# Patient Record
Sex: Female | Born: 1960 | Race: White | Hispanic: No | Marital: Married | Smoking: Former smoker
Health system: Southern US, Community
[De-identification: ages and names within clinical notes are randomized; demographics above are authoritative.]

## PROBLEM LIST (undated history)

## (undated) DIAGNOSIS — I10 Essential (primary) hypertension: Secondary | ICD-10-CM

## (undated) DIAGNOSIS — R112 Nausea with vomiting, unspecified: Secondary | ICD-10-CM

## (undated) DIAGNOSIS — M1990 Unspecified osteoarthritis, unspecified site: Secondary | ICD-10-CM

## (undated) DIAGNOSIS — M792 Neuralgia and neuritis, unspecified: Secondary | ICD-10-CM

## (undated) DIAGNOSIS — Z8619 Personal history of other infectious and parasitic diseases: Secondary | ICD-10-CM

## (undated) DIAGNOSIS — M62838 Other muscle spasm: Secondary | ICD-10-CM

## (undated) DIAGNOSIS — G8929 Other chronic pain: Secondary | ICD-10-CM

## (undated) DIAGNOSIS — K5120 Ulcerative (chronic) proctitis without complications: Secondary | ICD-10-CM

## (undated) DIAGNOSIS — K644 Residual hemorrhoidal skin tags: Secondary | ICD-10-CM

## (undated) DIAGNOSIS — K648 Other hemorrhoids: Secondary | ICD-10-CM

## (undated) DIAGNOSIS — M549 Dorsalgia, unspecified: Secondary | ICD-10-CM

## (undated) DIAGNOSIS — Z8709 Personal history of other diseases of the respiratory system: Secondary | ICD-10-CM

## (undated) DIAGNOSIS — E785 Hyperlipidemia, unspecified: Secondary | ICD-10-CM

## (undated) DIAGNOSIS — G43909 Migraine, unspecified, not intractable, without status migrainosus: Secondary | ICD-10-CM

## (undated) DIAGNOSIS — Z9889 Other specified postprocedural states: Secondary | ICD-10-CM

## (undated) HISTORY — DX: Other specified postprocedural states: R11.2

## (undated) HISTORY — DX: Residual hemorrhoidal skin tags: K64.4

## (undated) HISTORY — PX: COLONOSCOPY: SHX174

## (undated) HISTORY — DX: Ulcerative (chronic) proctitis without complications: K51.20

## (undated) HISTORY — DX: Hyperlipidemia, unspecified: E78.5

## (undated) HISTORY — PX: OTHER SURGICAL HISTORY: SHX169

## (undated) HISTORY — DX: Neuralgia and neuritis, unspecified: M79.2

## (undated) HISTORY — PX: BREAST ENHANCEMENT SURGERY: SHX7

## (undated) HISTORY — PX: BUNIONECTOMY: SHX129

## (undated) HISTORY — DX: Other specified postprocedural states: Z98.890

## (undated) HISTORY — DX: Essential (primary) hypertension: I10

## (undated) HISTORY — DX: Unspecified osteoarthritis, unspecified site: M19.90

## (undated) HISTORY — DX: Other hemorrhoids: K64.8

## (undated) HISTORY — DX: Migraine, unspecified, not intractable, without status migrainosus: G43.909

## (undated) HISTORY — PX: ANTERIOR CRUCIATE LIGAMENT REPAIR: SHX115

## (undated) HISTORY — PX: POLYPECTOMY: SHX149

---

## 1999-12-05 ENCOUNTER — Other Ambulatory Visit: Admission: RE | Admit: 1999-12-05 | Discharge: 1999-12-05 | Payer: Self-pay | Admitting: Family Medicine

## 2001-05-28 ENCOUNTER — Ambulatory Visit (HOSPITAL_BASED_OUTPATIENT_CLINIC_OR_DEPARTMENT_OTHER): Admission: RE | Admit: 2001-05-28 | Discharge: 2001-05-29 | Payer: Self-pay | Admitting: Otolaryngology

## 2001-05-28 ENCOUNTER — Encounter (INDEPENDENT_AMBULATORY_CARE_PROVIDER_SITE_OTHER): Payer: Self-pay | Admitting: *Deleted

## 2001-10-29 ENCOUNTER — Other Ambulatory Visit: Admission: RE | Admit: 2001-10-29 | Discharge: 2001-10-29 | Payer: Self-pay | Admitting: Family Medicine

## 2002-11-25 ENCOUNTER — Other Ambulatory Visit: Admission: RE | Admit: 2002-11-25 | Discharge: 2002-11-25 | Payer: Self-pay | Admitting: Family Medicine

## 2003-12-28 ENCOUNTER — Other Ambulatory Visit: Admission: RE | Admit: 2003-12-28 | Discharge: 2003-12-28 | Payer: Self-pay | Admitting: Family Medicine

## 2005-03-18 ENCOUNTER — Other Ambulatory Visit: Admission: RE | Admit: 2005-03-18 | Discharge: 2005-03-18 | Payer: Self-pay | Admitting: Family Medicine

## 2006-10-15 ENCOUNTER — Encounter: Admission: RE | Admit: 2006-10-15 | Discharge: 2006-10-15 | Payer: Self-pay | Admitting: Family Medicine

## 2006-10-21 ENCOUNTER — Encounter: Admission: RE | Admit: 2006-10-21 | Discharge: 2006-10-21 | Payer: Self-pay | Admitting: Specialist

## 2006-11-18 ENCOUNTER — Encounter: Admission: RE | Admit: 2006-11-18 | Discharge: 2006-11-18 | Payer: Self-pay | Admitting: Specialist

## 2007-09-08 ENCOUNTER — Encounter: Admission: RE | Admit: 2007-09-08 | Discharge: 2007-09-08 | Payer: Self-pay | Admitting: Family Medicine

## 2008-02-25 ENCOUNTER — Encounter: Admission: RE | Admit: 2008-02-25 | Discharge: 2008-02-25 | Payer: Self-pay | Admitting: Family Medicine

## 2008-03-13 ENCOUNTER — Encounter: Admission: RE | Admit: 2008-03-13 | Discharge: 2008-03-13 | Payer: Self-pay | Admitting: Family Medicine

## 2008-08-08 ENCOUNTER — Ambulatory Visit (HOSPITAL_COMMUNITY): Admission: RE | Admit: 2008-08-08 | Discharge: 2008-08-09 | Payer: Self-pay | Admitting: Neurological Surgery

## 2008-11-09 ENCOUNTER — Encounter: Admission: RE | Admit: 2008-11-09 | Discharge: 2008-11-09 | Payer: Self-pay | Admitting: Neurological Surgery

## 2009-08-17 HISTORY — PX: COLONOSCOPY W/ BIOPSIES: SHX1374

## 2010-02-18 ENCOUNTER — Emergency Department (HOSPITAL_COMMUNITY): Admission: EM | Admit: 2010-02-18 | Discharge: 2010-02-18 | Payer: Self-pay | Admitting: Emergency Medicine

## 2010-02-22 ENCOUNTER — Emergency Department (HOSPITAL_COMMUNITY): Admission: EM | Admit: 2010-02-22 | Discharge: 2010-02-22 | Payer: Self-pay | Admitting: Emergency Medicine

## 2010-06-06 ENCOUNTER — Encounter: Admission: RE | Admit: 2010-06-06 | Discharge: 2010-06-06 | Payer: Self-pay | Admitting: Neurological Surgery

## 2010-07-18 ENCOUNTER — Ambulatory Visit: Payer: Self-pay | Admitting: Pulmonary Disease

## 2010-07-18 DIAGNOSIS — I10 Essential (primary) hypertension: Secondary | ICD-10-CM | POA: Insufficient documentation

## 2010-07-18 DIAGNOSIS — G4700 Insomnia, unspecified: Secondary | ICD-10-CM | POA: Insufficient documentation

## 2010-07-30 DIAGNOSIS — G4700 Insomnia, unspecified: Secondary | ICD-10-CM | POA: Insufficient documentation

## 2010-10-01 NOTE — Assessment & Plan Note (Signed)
Summary: consult for insomnia, poor sleep hygiene.   Visit Type:  Initial Consult Copy to:  Edwinna Areola  Primary Mauricia Mertens/Referring Keisha Amer:  Gaynelle Arabian MD  CC:  Sleep consult. wake up multiples times at night and very tired in the day. Marland Kitchen  History of Present Illness: The pt is a 50y/o female who I have been asked to see for insomnia.  The pt has been having sleeping issues for the last 6-7 yrs, with both sleep onset and maintenance components.  She typically goes to bed btw 930 and 1030, and it will take her 2 hours to fall asleep if she does not take a sleeping pill.  With her Lorrin Mais, it will take about 42mn.  When she goes to bed at her usual time she is never sleepy, but if she waits until 12-1am, she will get to sleep quickly even without a medication.  She typically will awaken 2-3 times a night, and may take her 10-693m to fall back asleep.  She awakens at 7-8 am to start her day, and never feels rested.  She denies any hx c/w RLS, and does not think she snores.  She does have chronic back issues, but does not feel it disturbs her sleep.  She does have 3 dogs that sleep with her and her husband in the bed everynight.  The pt feels tired "all the time" during the day, and definitely naps in the afternoons.  Regarding her sleep hygiene, she does read in bed, and has a clock that projects the time up to the ceiling.  She also drinks 2 cups of coffee in the am, 1-2 caff. sodas in the afternoon, and used to drink "energy drinks" which she has now cut out.  She also notes that she can sometime have sleepiness while driving during the day.    Preventive Screening-Counseling & Management  Alcohol-Tobacco     Smoking Status: quit  Current Medications (verified): 1)  Amlodipine Besy-Benazepril Hcl 2.5-10 Mg Caps (Amlodipine Besy-Benazepril Hcl) .... Once Daily 2)  Cymbalta 60 Mg Cpep (Duloxetine Hcl) .... Once Daily 3)  Zolpidem Tartrate 10 Mg Tabs (Zolpidem Tartrate) .... 1/2 Once  Daily  Allergies (verified): 1)  ! AsDiona FantiPast History:  Past Medical History: colitis Hypertension  Past Surgical History: cyst removed off back breast implants and replaced  Family History: Reviewed history and no changes required. lymphoma--mother  Social History: Reviewed history and no changes required. Patient states former smoker. quit in 1970's. <1ppd. started 1960's. occupation-- garden maintance, pt therapt teacher marriedSmoking Status:  quit  Review of Systems  The patient denies shortness of breath with activity, shortness of breath at rest, productive cough, non-productive cough, coughing up blood, chest pain, irregular heartbeats, acid heartburn, indigestion, loss of appetite, weight change, abdominal pain, difficulty swallowing, sore throat, tooth/dental problems, headaches, nasal congestion/difficulty breathing through nose, sneezing, itching, ear ache, anxiety, depression, hand/feet swelling, joint stiffness or pain, rash, change in color of mucus, and fever.    Vital Signs:  Patient profile:   [age]ear old female Height:      62 inches Weight:      130 pounds BMI:     23.86 O2 Sat:      99 % on Room air Temp:     98.4 degrees F oral Pulse rate:   70 / minute BP sitting:   122 / 70  (left arm) Cuff size:   regular  Vitals Entered By: MiCharma IgoNovember 17, 2011 3:01 PM)  O2 Flow:  Room air  Physical Exam  General:  wd female in nad Eyes:  PERRLA and EOMI.   Nose:  patent without discharge Mouth:  normal anatomy, no exudates. Neck:  no jvd, tmg, LN Lungs:  totally clear with no wheezing or rhonchi Heart:  rrr, no mrg Abdomen:  soft and nontender, bs+ Extremities:  no edema noted, pulses intact distally no cyanosis  Neurologic:  alert and oriented ,moves all 4.   Impression & Recommendations:  Problem # 1:  INSOMNIA (ICD-780.52) the pt has longstanding insomnia that is related to multiple factors.  She has sleep hygiene issues with going  to bed before she is sleepy, reading in bed with insomnia issues, having 3 dogs sleep in the same bed, and napping during the day.  I have discussed these with her, and she will try to make corrections.  She also has behavioral issues with the inability to "turn off brain".  I have discussed with her the techniques of ritualistic behaviors and stimulus control therapy.  I have outlined what she needs to do, and is willing to give this a try.  I have reminded her that medications NEVER solve longstanding insomnia issues.  Medications Added to Medication List This Visit: 1)  Amlodipine Besy-benazepril Hcl 2.5-10 Mg Caps (Amlodipine besy-benazepril hcl) .... Once daily 2)  Cymbalta 60 Mg Cpep (Duloxetine hcl) .... Once daily 3)  Zolpidem Tartrate 10 Mg Tabs (Zolpidem tartrate) .... 1/2 once daily  Other Orders: Consultation Level V [phone])  Patient Instructions: 1)  stop sleeping pill 2)  do not go to bed before MN, and do not stay in bed more than 35mn if you cannot initiate sleep.   3)  turn off light on alarm clock, no reading or watching tv in bed. 4)  no napping during day, no caffeine after 10am. 5)  If you awaken during the night and cannot get back to sleep within 353m, leave bedroom. 6)  please call me in 2 weeks with your response.

## 2011-01-14 NOTE — Op Note (Signed)
NAME:  Shawna, Dean            ACCOUNT NO.:  [id number]   MEDICAL RECORD NO.:  [id number]          PATIENT TYPE:  OIB   LOCATION:  0762                         FACILITY:  [facility]   PHYSICIAN:  Earleen Newport, M.D.  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   OPERATION:  Laminotomy and foraminotomy with operating microscope,  resection of synovial cyst, L4-L5 right.   SURGEON:  Earleen Newport, MD   FIRST ASSISTANT:  Leeroy Cha, MD   ANESTHESIA:  General endotracheal.   INDICATIONS:  Shawna Dean is a 50 year old individual who has had  significant right lumbar radiculopathy for a number of months nearing a  year's time she has also had some episodic back pain associated with  this with significant muscle spasm.  She has evidence of a synovial cyst  on the right side at the L4-L5 level and has been advised regarding  surgical resection with a laminotomy and foraminotomy.   PROCEDURE:  The patient was brought to the operating room supine on the  stretcher after smooth induction of general endotracheal anesthesia, she  was turned prone.  The back was prepped with alcohol and DuraPrep,  draped in sterile fashion.  Localizing radiograph was identified to the  area of L4-L5 and an incision was made on the surface of the skin and  carried down to the lumbodorsal fascia.  The fascia was opened on the  right side of the midline and the interlaminar space at L4-L5 was  identified by a second radiograph.  With a self-retaining retractor in  place and the soft tissues overlying the facet joint at L4-L5 were  cleared.  There is noted to be significant extrinsic growth of synovium  around the  posterior and inferior aspect of the facet joint itself.  Once this was resected, the laminotomy could be created removing  inferior margin lamina of L4 out to the medial wall of facet.  Medial  facetectomy was also produced and then by undercutting the superior  articular process of L5, the yellow ligament could be loosened and along  with this was noted be an intrinsic synovial cyst that was causing  dorsal and lateral compression on the region of the L5 nerve root.  This  was then resected in a piecemeal fashion, carefully dissecting the  cystic structure from the L5 nerve root, it was moderately adherent to  the root itself.  The entirety of the cyst was resected and the superior  portion of facet joint was then undercut to remove remnants of the  yellow ligament that were attached to the cyst itself.  In the end, the  entire area was inspected with the use of the operating microscope,  which was also used for the dissection and resection of the cyst.  Dr.  Joya Salm provided assistance by retracting the dura while the cyst  resection was undertaken.  Once the resection was completed and  hemostasis was achieved, the wound was irrigated  copiously with antibiotic irrigating solution and the lumbodorsal fascia  was closed with #1 Vicryl in interrupted fashion, 2-0 Vicryl using  subcutaneous tissues, and 3-0 Vicryl subcuticularly.  Dermabond was  placed on the skin.  Blood loss was nil for this procedure.      Earleen Newport, M.D.  Electronically Signed     HJE/MEDQ  D:  08/08/2008  T:  08/08/2008  Job:  [id number]

## 2011-01-17 NOTE — Op Note (Signed)
[facility]  Patient:    [name], [name] Visit Number: [id number] MRN: [id number]          Service Type: DSU Location: [facility] Attending Physician:  Clare Charon Dictated by:   Leonides Sake Lucia Gaskins, M.D. Proc. Date: 05/28/01 Admit Date:  05/28/2001   CC:         Delanna Ahmadi, M.D.  Thornell Sartorius, M.D.   Operative Report  PREOPERATIVE DIAGNOSIS:  Right parotid mass.  POSTOPERATIVE DIAGNOSIS:  Right parotid mass.  OPERATION PERFORMED:  Right superficial parotidectomy with facial nerve dissection.  SURGEON:  Leonides Sake. Lucia Gaskins, M.D.  ASSISTANT:  Thornell Sartorius, M.D.  ANESTHESIA:  General endotracheal.  COMPLICATIONS:  None.  INDICATIONS FOR PROCEDURE:  Shawna Dean is a 50 year old female who has had a slowly enlarging mass below the right ear lobe.  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  She is taken to the operating room at this time for resection of parotid neoplasm with facial nerve dissection.  DESCRIPTION OF PROCEDURE:  After adequate endotracheal anesthesia, the right face was prepped and draped with Betadine solution and draped out with sterile towels.  The proposed incision was marked out and injected with Xylocaine with epinephrine.  Incision was made curvilinear directly over the mass.  Skin flaps were elevated over the parotid fascia anteriorly and posteriorly.  The mass was located in the region of the tail of the parotid.  The tail of the parotid was then dissected off of the sternocleidomastoid muscle posteriorly and inferiorly.  Anteriorly the parotid tissue was transected with a cautery and the marginal mandibular nerve was identified at the posterior aspect of the ramus of the mandible.  After identifying the marginal mandibular nerve, the parotid mass was dissected  up and resected with the marginal nerve under direct visualization.  The specimen was sent to pathology in saline. Hemostasis was obtained with bipolar cautery and 4-0 silk ligatures.  The wound was irrigated.  A #10 French drain was brought out through a separate stab incision and the incision was closed with 4-0 chromic sutures subcutaneously and 5-0 nylon on the skin.  The patient tolerated the procedure well.  The patient tolerated the procedure well, was awakened from anesthesia and transferred to the recovery room postoperatively doing well.  DISPOSITION:  Elliett will be observed overnight in the [place] and discharged home in the morning after removing the JP drain.  She is given Tylenol and Tylenol #3 p.r.n. pain.  Will have her follow up in my office in five or six days to have the sutures removed and review pathology. Dictated by:   Leonides Sake Lucia Gaskins, M.D. Attending Physician:  Clare Charon DD:  05/28/01 TD:  05/28/01 Job: [id number]

## 2011-06-06 LAB — BASIC METABOLIC PANEL
BUN: 9 mg/dL (ref 6–23)
CO2: 28 mEq/L (ref 19–32)
Calcium: 9.4 mg/dL (ref 8.4–10.5)
Chloride: 106 mEq/L (ref 96–112)
Creatinine, Ser: 0.71 mg/dL (ref 0.4–1.2)
GFR calc Af Amer: >60 mL/min (ref >60)
GFR calc non Af Amer: >60 mL/min (ref >60)
Glucose, Bld: 92 mg/dL (ref 70–99)
Potassium: 4.6 mEq/L (ref 3.5–5.1)
Sodium: 139 mEq/L (ref 135–145)

## 2011-06-06 LAB — CBC
HCT: 42.2 % (ref 36.0–46.0)
Hemoglobin: 14 g/dL (ref 12.0–15.0)
MCHC: 33.2 g/dL (ref 30.0–36.0)
MCV: 90.5 fL (ref 78.0–100.0)
Platelets: 212 10*3/uL (ref 150–400)
RBC: 4.67 MIL/uL (ref 3.87–5.11)
RDW: 13.4 % (ref 11.5–15.5)
WBC: 6.8 10*3/uL (ref 4.0–10.5)

## 2011-07-07 ENCOUNTER — Other Ambulatory Visit: Payer: Self-pay | Admitting: Anesthesiology

## 2011-07-07 DIAGNOSIS — M545 Low back pain, unspecified: Secondary | ICD-10-CM

## 2011-07-15 ENCOUNTER — Ambulatory Visit
Admission: RE | Admit: 2011-07-15 | Discharge: 2011-07-15 | Disposition: A | Discharge status: Home / Self Care | Payer: BC Managed Care – PPO | Source: Ambulatory Visit | Attending: Anesthesiology | Admitting: Anesthesiology

## 2011-07-15 DIAGNOSIS — M545 Low back pain, unspecified: Secondary | ICD-10-CM

## 2011-07-15 MED ORDER — GADOBENATE DIMEGLUMINE 529 MG/ML IV SOLN
11.0000 mL | Freq: Once | INTRAVENOUS | Status: AC | PRN
  Administered 2011-07-15: 11 mL via INTRAVENOUS

## 2012-11-15 ENCOUNTER — Ambulatory Visit
Admission: RE | Admit: 2012-11-15 | Discharge: 2012-11-15 | Disposition: A | Discharge status: Home / Self Care | Payer: BC Managed Care – PPO | Source: Ambulatory Visit | Attending: Physician Assistant | Admitting: Physician Assistant

## 2012-11-15 ENCOUNTER — Other Ambulatory Visit: Payer: Self-pay | Admitting: Physician Assistant

## 2012-11-15 DIAGNOSIS — M25551 Pain in right hip: Secondary | ICD-10-CM

## 2012-11-15 DIAGNOSIS — M545 Low back pain, unspecified: Secondary | ICD-10-CM

## 2013-01-21 ENCOUNTER — Telehealth: Payer: Self-pay | Admitting: Obstetrics & Gynecology

## 2013-03-22 ENCOUNTER — Telehealth: Payer: Self-pay | Admitting: *Deleted

## 2013-03-22 NOTE — Telephone Encounter (Signed)
Per documentation from Dr. Hyacinth Meeker, [facility] Mammogram report for 02/11/2013 placed out of HOLD on 03/21/2013.

## 2013-07-18 ENCOUNTER — Other Ambulatory Visit: Payer: Self-pay | Admitting: Physical Medicine and Rehabilitation

## 2013-07-18 DIAGNOSIS — M5416 Radiculopathy, lumbar region: Secondary | ICD-10-CM

## 2013-08-02 ENCOUNTER — Ambulatory Visit
Admission: RE | Admit: 2013-08-02 | Discharge: 2013-08-02 | Disposition: A | Discharge status: Home / Self Care | Payer: BC Managed Care – PPO | Source: Ambulatory Visit | Attending: Physical Medicine and Rehabilitation | Admitting: Physical Medicine and Rehabilitation

## 2013-08-02 DIAGNOSIS — M5416 Radiculopathy, lumbar region: Secondary | ICD-10-CM

## 2013-08-02 MED ORDER — GADOBENATE DIMEGLUMINE 529 MG/ML IV SOLN
13.0000 mL | Freq: Once | INTRAVENOUS | Status: AC | PRN
  Administered 2013-08-02: 13 mL via INTRAVENOUS

## 2013-09-20 ENCOUNTER — Encounter: Payer: Self-pay | Admitting: Obstetrics & Gynecology

## 2013-09-22 ENCOUNTER — Ambulatory Visit: Payer: Self-pay | Admitting: Obstetrics & Gynecology

## 2013-09-22 ENCOUNTER — Encounter: Payer: Self-pay | Admitting: Obstetrics & Gynecology

## 2013-11-14 ENCOUNTER — Ambulatory Visit (INDEPENDENT_AMBULATORY_CARE_PROVIDER_SITE_OTHER): Payer: BC Managed Care – PPO | Admitting: Obstetrics & Gynecology

## 2013-11-14 ENCOUNTER — Encounter: Payer: Self-pay | Admitting: Obstetrics & Gynecology

## 2013-11-14 VITALS — BP 120/76 | HR 68 | Resp 16 | Ht 61.5 in | Wt 149.8 lb

## 2013-11-14 DIAGNOSIS — Z1211 Encounter for screening for malignant neoplasm of colon: Secondary | ICD-10-CM

## 2013-11-14 DIAGNOSIS — Z01419 Encounter for gynecological examination (general) (routine) without abnormal findings: Secondary | ICD-10-CM

## 2013-11-14 MED ORDER — MUPIROCIN 2 % EX OINT: 1.0000 "application " | TOPICAL_OINTMENT | Freq: Two times a day (BID) | CUTANEOUS | Status: DC

## 2013-11-14 NOTE — Patient Instructions (Addendum)

## 2013-11-14 NOTE — Progress Notes (Signed)
53 y.o. G0P0 MarriedCaucasianF here for annual exam.  Doing well.  No vaginal bleeding.  Having some issues with hardness around her right silicone implant.  Having a lot more back issues.  Saw Dr. Mertie Moores, [place].  He did not recommend surgery at this time.  Has been going to pain management.  Doing nerve blocks for the time being and that is helping.    Patient's last menstrual period was 03/02/2011.          Sexually active: yes  The current method of family planning is none.    Exercising: yes   Smoker:  no  Health Maintenance: Pap:  06/29/12 WNL/negative HR HPV History of abnormal Pap:  no MMG:  02/11/13 3D-normal Colonoscopy:  2010-due this year BMD:   none TDaP:  Up to date Screening Labs: PCP, Hb today: PCP, Urine today: PCP   reports that she has never smoked. She has never used smokeless tobacco. She reports that she does not drink alcohol or use illicit drugs.  Past Medical History  Diagnosis Date  . Hypertension   . Migraines     better with controlling hypertension  . Ulcerative colitis   . Arthritis     spine/hands  . S/P breast implant, silicone     Past Surgical History  Procedure Laterality Date  . Spinal cyst aspiration    . Bunionectomy    . Anterior cruciate ligament repair    . Breast enhancement surgery    . Cystectomy      behind ear    Current Outpatient Prescriptions  Medication Sig Dispense Refill  . amlodipine-benazepril (LOTREL) 2.5-10 MG per capsule Take 1 capsule by mouth daily.      . Calcium-Magnesium-Vitamin D (CALCIUM 500 PO) Take by mouth daily.      . cyclobenzaprine (FLEXERIL) 5 MG tablet       . DENTA 5000 PLUS 1.1 % CREA dental cream       . diclofenac sodium (VOLTAREN) 1 % GEL Apply topically as needed.      . DULoxetine (CYMBALTA) 60 MG capsule Take 60 mg by mouth daily.      . mesalamine (CANASA) 1000 MG suppository Place 1,000 mg rectally as needed.      . Multiple Vitamin (MULTIVITAMIN) tablet Take 1 tablet by mouth daily.       . mupirocin cream (BACTROBAN) 2 % Apply 1 application topically 2 (two) times daily.      . mupirocin ointment (BACTROBAN) 2 % Place 1 application into the nose 2 (two) times daily.      . OXYCONTIN 30 MG T12A 2 (two) times daily.      Marland Kitchen triamcinolone (KENALOG) 0.1 % paste       . VITAMIN D, CHOLECALCIFEROL, PO Take 4,000 Units by mouth daily.       No current facility-administered medications for this visit.    Family History  Problem Relation Age of Onset  . Lymphoma Mother 12  . Parkinson's disease Father     ROS:  Pertinent items are noted in HPI.  Otherwise, a comprehensive ROS was negative.  Exam:   BP 120/76  Pulse 68  Resp 16  Ht 5' 1.5" (1.562 m)  Wt 149 lb 12.8 oz (67.949 kg)  BMI 27.85 kg/m2  LMP 03/02/2011  Weight change:+17lbs  Height: 5' 1.5" (156.2 cm)  Ht Readings from Last 3 Encounters:  11/14/13 5' 1.5" (1.562 m)  07/18/10 5\' 2"  (1.575 m)    General appearance: alert, cooperative  and appears stated age Head: Normocephalic, without obvious abnormality, atraumatic Neck: no adenopathy, supple, symmetrical, trachea midline and thyroid normal to inspection and palpation Lungs: clear to auscultation bilaterally Breasts: normal appearance, no masses or tenderness, with increased firmness around the right implant Heart: regular rate and rhythm Abdomen: soft, non-tender; bowel sounds normal; no masses,  no organomegaly Extremities: extremities normal, atraumatic, no cyanosis or edema Skin: Skin color, texture, turgor normal. No rashes or lesions Lymph nodes: Cervical, supraclavicular, and axillary nodes normal. No abnormal inguinal nodes palpated Neurologic: Grossly normal   Pelvic: External genitalia:  no lesions              Urethra:  normal appearing urethra with no masses, tenderness or lesions              Bartholins and Skenes: normal                 Vagina: normal appearing vagina with normal color and discharge, no lesions              Cervix: no  lesions              Pap taken: no Bimanual Exam:  Uterus:  normal size, contour, position, consistency, mobility, non-tender              Adnexa: normal adnexa and no mass, fullness, tenderness               Rectovaginal: Confirms               Anus:  normal sphincter tone, no lesions  A:  Well Woman with normal exam PMP, no HRT Ulcerative colitis Firmness around right breast implant  P:   Mammogram yearly pap smear with neg HR HPV 10/13 Referral to Dr. Olevia Perches for screening colonoscopy. Mupirocin rx to pharmacy. Names given for plastic surgeons. BMD and MMG to be scheduled for pt at [facility] in June. return annually or prn  An After Visit Summary was printed and given to the patient.

## 2013-11-23 ENCOUNTER — Telehealth: Payer: Self-pay | Admitting: Obstetrics & Gynecology

## 2013-11-23 ENCOUNTER — Encounter: Payer: Self-pay | Admitting: Internal Medicine

## 2013-11-23 NOTE — Telephone Encounter (Signed)
Patient is scheduled with Dr. Olevia Perches 05.13.2015 @ am for instructions/ 05.27.2015 @ 9am for procedure (check in at 8). Suanne Marker will mail all appointment information to the patient.

## 2014-01-25 ENCOUNTER — Encounter: Payer: BC Managed Care – PPO | Admitting: Internal Medicine

## 2014-03-20 ENCOUNTER — Telehealth: Payer: Self-pay | Admitting: *Deleted

## 2014-03-20 NOTE — Telephone Encounter (Signed)
Returning a call to [place]

## 2014-03-20 NOTE — Telephone Encounter (Signed)
LM for pt to call back regarding Bone Density results.  - "Please call mild osteopenia results. Try to get 1200 - 1500 calcium and 800 unit of Vitamin D. Repeat 3 - 5 years" - By Dr. Sabra Heck.

## 2014-03-20 NOTE — Telephone Encounter (Signed)
Patient notified. Verbalized understanding. Please refer to Bone density Scan.

## 2014-03-23 ENCOUNTER — Telehealth: Payer: Self-pay

## 2014-03-23 NOTE — Telephone Encounter (Signed)
LMOVM on #[phone] to call for BMD results.(Pt. Of Dr. Ammie Ferrier but Dr. Quincy Simmonds reviewed results)

## 2014-03-30 ENCOUNTER — Telehealth: Payer: Self-pay

## 2014-03-30 NOTE — Telephone Encounter (Signed)
Called pt per Dr. Quincy Simmonds to give results of BMD from [facility].  Unable to speak to pt. But LMOVM at cell #[phone] to call to discuss results.  If pt.calls for results, please advise pt BMD showed mild osteopenia.  She needs to be taking Calcium with Vit D and doing weight bearing exercise and repeat study 2 years.

## 2014-03-30 NOTE — Telephone Encounter (Signed)
Patient notified of results.  She states she does takes calcium with Vit D and will begin weight bearing exercises.

## 2014-03-30 NOTE — Telephone Encounter (Signed)
See telephone note of 03-30-14.

## 2014-10-20 ENCOUNTER — Other Ambulatory Visit: Payer: Self-pay | Admitting: Orthopaedic Surgery

## 2014-10-20 DIAGNOSIS — M25521 Pain in right elbow: Secondary | ICD-10-CM

## 2014-10-28 ENCOUNTER — Ambulatory Visit
Admission: RE | Admit: 2014-10-28 | Discharge: 2014-10-28 | Disposition: A | Discharge status: Home / Self Care | Payer: BLUE CROSS/BLUE SHIELD | Source: Ambulatory Visit | Attending: Orthopaedic Surgery | Admitting: Orthopaedic Surgery

## 2014-10-28 DIAGNOSIS — M25521 Pain in right elbow: Secondary | ICD-10-CM

## 2014-11-17 ENCOUNTER — Ambulatory Visit (INDEPENDENT_AMBULATORY_CARE_PROVIDER_SITE_OTHER): Payer: BLUE CROSS/BLUE SHIELD | Admitting: Obstetrics & Gynecology

## 2014-11-17 ENCOUNTER — Encounter: Payer: Self-pay | Admitting: Obstetrics & Gynecology

## 2014-11-17 VITALS — BP 110/68 | HR 64 | Resp 16 | Ht 61.5 in | Wt 135.6 lb

## 2014-11-17 DIAGNOSIS — Z01419 Encounter for gynecological examination (general) (routine) without abnormal findings: Secondary | ICD-10-CM

## 2014-11-17 DIAGNOSIS — Z1211 Encounter for screening for malignant neoplasm of colon: Secondary | ICD-10-CM | POA: Diagnosis not present

## 2014-11-17 DIAGNOSIS — Z124 Encounter for screening for malignant neoplasm of cervix: Secondary | ICD-10-CM | POA: Diagnosis not present

## 2014-11-17 NOTE — Progress Notes (Signed)
54 y.o. G0P0 MarriedCaucasianF here for annual exam.  Doing well.  No vaginal bleeding.    PCP:  Dr. Marisue Humble.  Appt in July  Patient's last menstrual period was 03/02/2011.          Sexually active: Yes.    The current method of family planning is post menopausal status.    Exercising: Yes.    walking Smoker:  Former smoker  Health Maintenance: Pap:  06/29/12 WNL/negative HR HPV History of abnormal Pap:  yes MMG:  03/10/14-normal Colonoscopy:  2010-due last year BMD:   03/10/14-mild osteopenia TDaP:  Up to date.  States she will check with Dr. Marisue Humble. Screening Labs: PCP, Hb today: PCP, Urine today: PCP   reports that she has quit smoking. She has never used smokeless tobacco. She reports that she does not drink alcohol or use illicit drugs.  Past Medical History  Diagnosis Date  . Hypertension   . Migraines     better with controlling hypertension  . Ulcerative colitis   . Arthritis     spine/hands  . S/P breast implant, silicone   . Torn tendon     right elbow-torn tendon and ligament    Past Surgical History  Procedure Laterality Date  . Spinal cyst aspiration      age 70  . Bunionectomy    . Anterior cruciate ligament repair    . Breast enhancement surgery      replaced a second time 9/11    Current Outpatient Prescriptions  Medication Sig Dispense Refill  . amlodipine-benazepril (LOTREL) 2.5-10 MG per capsule Take 1 capsule by mouth daily.    . Calcium-Magnesium-Vitamin D (CALCIUM 500 PO) Take by mouth daily.    . cyclobenzaprine (FLEXERIL) 5 MG tablet     . DENTA 5000 PLUS 1.1 % CREA dental cream     . diclofenac sodium (VOLTAREN) 1 % GEL Apply topically as needed.    . DULoxetine (CYMBALTA) 60 MG capsule Take 60 mg by mouth daily.    . mesalamine (CANASA) 1000 MG suppository Place 1,000 mg rectally as needed.    . Multiple Vitamin (MULTIVITAMIN) tablet Take 1 tablet by mouth daily.    . mupirocin ointment (BACTROBAN) 2 % Place 1 application into the nose 2  (two) times daily. 22 g 1  . nitroGLYCERIN (NITRODUR - DOSED IN MG/24 HR) 0.1 mg/hr patch Using every other day for torn tendon in elbow  6  . OXYCONTIN 20 MG T12A 12 hr tablet   0  . triamcinolone (KENALOG) 0.1 % paste     . VITAMIN D, CHOLECALCIFEROL, PO Take 4,000 Units by mouth daily.     No current facility-administered medications for this visit.    Family History  Problem Relation Age of Onset  . Lymphoma Mother 47  . Parkinson's disease Father     ROS:  Pertinent items are noted in HPI.  Otherwise, a comprehensive ROS was negative.  Exam:   General appearance: alert, cooperative and appears stated age Head: Normocephalic, without obvious abnormality, atraumatic Neck: no adenopathy, supple, symmetrical, trachea midline and thyroid normal to inspection and palpation Lungs: clear to auscultation bilaterally Breasts: normal appearance, no masses or tenderness Heart: regular rate and rhythm Abdomen: soft, non-tender; bowel sounds normal; no masses,  no organomegaly Extremities: extremities normal, atraumatic, no cyanosis or edema Skin: Skin color, texture, turgor normal. No rashes or lesions Lymph nodes: Cervical, supraclavicular, and axillary nodes normal. No abnormal inguinal nodes palpated Neurologic: Grossly normal   Pelvic: External  genitalia:  no lesions              Urethra:  normal appearing urethra with no masses, tenderness or lesions              Bartholins and Skenes: normal                 Vagina: normal appearing vagina with normal color and discharge, no lesions              Cervix: no lesions              Pap taken: Yes.   Bimanual Exam:  Uterus:  normal size, contour, position, consistency, mobility, non-tender              Adnexa: normal adnexa and no mass, fullness, tenderness               Rectovaginal: Confirms               Anus:  normal sphincter tone, no lesions  Chaperone was present for exam.  A:  Well Woman with normal exam PMP, no  HRT Ulcerative colitis Firmness around right breast implant  P: Mammogram yearly pap smear with neg HR HPV 10/13.  Pap today. Referral to Dr. Carlean Purl for coloscopy. Names given for plastic surgeons. return annually or prn

## 2014-11-21 LAB — IPS PAP TEST WITH REFLEX TO HPV

## 2014-12-18 ENCOUNTER — Encounter: Payer: Self-pay | Admitting: Internal Medicine

## 2015-02-27 ENCOUNTER — Other Ambulatory Visit: Payer: Self-pay | Admitting: Physical Medicine and Rehabilitation

## 2015-02-27 DIAGNOSIS — M545 Low back pain: Secondary | ICD-10-CM

## 2015-03-14 ENCOUNTER — Ambulatory Visit
Admission: RE | Admit: 2015-03-14 | Discharge: 2015-03-14 | Disposition: A | Discharge status: Home / Self Care | Payer: BLUE CROSS/BLUE SHIELD | Source: Ambulatory Visit | Attending: Physical Medicine and Rehabilitation | Admitting: Physical Medicine and Rehabilitation

## 2015-03-14 DIAGNOSIS — M545 Low back pain: Secondary | ICD-10-CM

## 2015-03-14 MED ORDER — GADOBENATE DIMEGLUMINE 529 MG/ML IV SOLN
12.0000 mL | Freq: Once | INTRAVENOUS | Status: AC | PRN
  Administered 2015-03-14: 12 mL via INTRAVENOUS

## 2015-05-22 ENCOUNTER — Telehealth: Payer: Self-pay | Admitting: Internal Medicine

## 2015-05-22 NOTE — Telephone Encounter (Signed)
Received records from [facility]. Patient would prefer to see Dr. Carlean Purl for a colonoscopy.   Records placed on Dr. Celesta Aver desk for review.

## 2015-05-24 ENCOUNTER — Encounter: Payer: Self-pay | Admitting: Internal Medicine

## 2015-05-24 NOTE — Telephone Encounter (Signed)
Dr. Carlean Purl approved and OV scheduled.

## 2015-06-18 ENCOUNTER — Telehealth: Payer: Self-pay

## 2015-06-18 NOTE — Telephone Encounter (Signed)
Order for bilateral diagnostic mammogram with ultrasound to Dr.Silva for review and signature before faxing to [facility].

## 2015-06-18 NOTE — Telephone Encounter (Signed)
Order signed and returned to your office.

## 2015-06-18 NOTE — Telephone Encounter (Signed)
OK to diagnostic mammogram and ultrasound of bilateral breasts.

## 2015-06-18 NOTE — Telephone Encounter (Signed)
Order faxed to [facility] with cover sheet and confirmation. Left a detailed message at number provided [phone]. Advised order has been sent to [facility] for her appointment on Wednesday. Advised to return call with any further questions or concerns.  Routing to provider for final review. Patient agreeable to disposition. Will close encounter.

## 2015-06-18 NOTE — Telephone Encounter (Signed)
Spoke with patient at time of incoming call. Passed from front desk. Patient states that she has bilateral implants. Patient fell over the weekend and is concerned one of her implants has ruptured. Is having burning in her left breast and is unable to find her implant. Denies any fevers or chills. "I have had an implant rupture before and it felt just like this." Patient has an appointment scheduled with [facility] on 06/20/2015 for diagnostic mammogram of left breast at [facility].   Dr.Silva okay to send order for diagnostic mammogram?

## 2015-06-27 ENCOUNTER — Telehealth: Payer: Self-pay | Admitting: Emergency Medicine

## 2015-06-27 NOTE — Telephone Encounter (Signed)
Chief Complaint  Patient presents with  . Results    [facility] Results   . Appointment   Message left to return call to [place] at [phone].   Mammogram results received from [facility]. Need to ensure that she has referral to plastic surgery for evaluation of Left breast implant rupture.

## 2015-06-27 NOTE — Telephone Encounter (Signed)
Janett Billow from [facility] called back she said patient is scheduled to see Dr. Harlow Mares 07/09/2015.

## 2015-06-27 NOTE — Telephone Encounter (Signed)
Patient returned call and confirms she is scheduled to See Dr. Harlow Mares. She will follow up with Dr. Sabra Heck as needed.  Routing to provider for final review. Patient agreeable to disposition. Will close encounter.

## 2015-07-19 ENCOUNTER — Other Ambulatory Visit: Payer: Self-pay | Admitting: Neurosurgery

## 2015-08-02 ENCOUNTER — Ambulatory Visit (INDEPENDENT_AMBULATORY_CARE_PROVIDER_SITE_OTHER): Payer: BLUE CROSS/BLUE SHIELD | Admitting: Internal Medicine

## 2015-08-02 ENCOUNTER — Encounter: Payer: Self-pay | Admitting: Internal Medicine

## 2015-08-02 VITALS — BP 96/64 | HR 72 | Ht 61.5 in | Wt 136.0 lb

## 2015-08-02 DIAGNOSIS — K5120 Ulcerative (chronic) proctitis without complications: Secondary | ICD-10-CM

## 2015-08-02 NOTE — Patient Instructions (Signed)
  Follow up with Dr Carlean Purl in a year.    We will put you in for a colon recall for 08/2019.     I appreciate the opportunity to care for you. Silvano Rusk, MD, [facility]

## 2015-08-03 ENCOUNTER — Encounter: Payer: Self-pay | Admitting: Internal Medicine

## 2015-08-03 NOTE — Progress Notes (Signed)
  Referred by: Gaynelle Arabian, MD and Dr. Edwinna Areola  Subjective:    Patient ID: Shawna Dean, female    DOB: [birth year], 54 y.o.   MRN: [id number] Chief Complaint: I think I need a colonoscopy HPI  Patient is a very nice married white woman , when she was 65 she was diagnosed with ulcerative proctitis. She has been treated with prednisone initially and Canasa suppositories successfully and now uses the suppositories intermittently only. Perhaps twice a year. 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 There is no family history of colon cancer that is significant.  Medications, allergies, past medical history, past surgical history, family history and social history are reviewed and updated in the EMR.   Review of Systems Chronic back pain, she is due for surgery later this year or in January. All other review of systems are negative    Objective:   Physical Exam @BP  96/64 mmHg  Pulse 72  Ht 5' 1.5" (1.562 m)  Wt 136 lb (61.689 kg)  BMI 25.28 kg/m2  LMP 03/02/2011@  General:  NAD Eyes:   anicteric Lungs:  clear Heart:: S1S2 no rubs, murmurs or gallops Abdomen:  soft and nontender, BS+ Ext:   no edema, cyanosis or clubbing    Data Reviewed:  Previous colonoscopy in 2010 and other GI notes from Dr. Watt Climes 2014 and pathology reports from 2010    Assessment & Plan:   1. 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 In my understanding these patients are not at increased risk of colon cancer and an every 10 year colonoscopy is reasonable particularly since she has been doing well over time using Canasa suppositories intermittently. We will plan on repeating a routine colonoscopy in 2020. She will see me annually for follow-up  and as needed sooner.  I appreciate the opportunity to care for this patient. CC: Simona Huh, MD Dr. Edwinna Areola

## 2015-09-24 ENCOUNTER — Other Ambulatory Visit: Payer: Self-pay

## 2015-09-24 ENCOUNTER — Encounter (HOSPITAL_COMMUNITY)
Admission: RE | Admit: 2015-09-24 | Discharge: 2015-09-24 | Disposition: A | Discharge status: Home / Self Care | Payer: BLUE CROSS/BLUE SHIELD | Source: Ambulatory Visit | Attending: Neurosurgery | Admitting: Neurosurgery

## 2015-09-24 ENCOUNTER — Encounter (HOSPITAL_COMMUNITY): Payer: Self-pay

## 2015-09-24 DIAGNOSIS — Z0183 Encounter for blood typing: Secondary | ICD-10-CM | POA: Insufficient documentation

## 2015-09-24 DIAGNOSIS — M4316 Spondylolisthesis, lumbar region: Secondary | ICD-10-CM | POA: Diagnosis not present

## 2015-09-24 DIAGNOSIS — Z01818 Encounter for other preprocedural examination: Secondary | ICD-10-CM | POA: Insufficient documentation

## 2015-09-24 DIAGNOSIS — Z01812 Encounter for preprocedural laboratory examination: Secondary | ICD-10-CM | POA: Insufficient documentation

## 2015-09-24 HISTORY — DX: Personal history of other diseases of the respiratory system: Z87.09

## 2015-09-24 HISTORY — DX: Other muscle spasm: M62.838

## 2015-09-24 HISTORY — DX: Other chronic pain: G89.29

## 2015-09-24 HISTORY — DX: Personal history of other infectious and parasitic diseases: Z86.19

## 2015-09-24 HISTORY — DX: Dorsalgia, unspecified: M54.9

## 2015-09-24 LAB — CBC
HCT: 38.9 % (ref 36.0–46.0)
Hemoglobin: 12.9 g/dL (ref 12.0–15.0)
MCH: 29 pg (ref 26.0–34.0)
MCHC: 33.2 g/dL (ref 30.0–36.0)
MCV: 87.4 fL (ref 78.0–100.0)
PLATELETS: 192 10*3/uL (ref 150–400)
RBC: 4.45 MIL/uL (ref 3.87–5.11)
RDW: 12.6 % (ref 11.5–15.5)
WBC: 6 10*3/uL (ref 4.0–10.5)

## 2015-09-24 LAB — BASIC METABOLIC PANEL
Anion gap: 12 (ref 5–15)
BUN: 7 mg/dL (ref 6–20)
CHLORIDE: 102 mmol/L (ref 101–111)
CO2: 24 mmol/L (ref 22–32)
CREATININE: 0.67 mg/dL (ref 0.44–1.00)
Calcium: 9.7 mg/dL (ref 8.9–10.3)
GFR calc Af Amer: >60 mL/min (ref >60)
Glucose, Bld: 88 mg/dL (ref 65–99)
Potassium: 4.4 mmol/L (ref 3.5–5.1)
SODIUM: 138 mmol/L (ref 135–145)

## 2015-09-24 LAB — SURGICAL PCR SCREEN
MRSA, PCR: NEGATIVE
Staphylococcus aureus: NEGATIVE

## 2015-09-24 LAB — TYPE AND SCREEN
ABO/RH(D): O POS
Antibody Screen: NEGATIVE

## 2015-09-24 LAB — ABO/RH: ABO/RH(D): O POS

## 2015-09-24 NOTE — Pre-Procedure Instructions (Signed)
Shawna Dean  09/24/2015      [id number] - [place], [place] [address] [place] [address] Phone: [phone] Fax: [phone]    Your procedure is scheduled on Mon, Jan 30 @ 7:30 AM  Report to [facility] Admitting at 5:30 AM  Call this number if you have problems the morning of surgery:  [phone]   Remember:  Do not eat food or drink liquids after midnight.  Take these medicines the morning of surgery with A SIP OF WATER Amlodipine-Benazepril(Lotrel),Cymbalta(Duloxetine),and Pain Pill(if needed)              No Goody's,BC's,Aleve,Aspirin,Ibuprofen,Advil,Motrin,Fish Oil,or any Herbal Medications.     Do not wear jewelry, make-up or nail polish.  Do not wear lotions, powders, or perfumes.  You may wear deodorant.  Do not shave 48 hours prior to surgery.    Do not bring valuables to the hospital.  [facility] is not responsible for any belongings or valuables.  Contacts, dentures or bridgework may not be worn into surgery.  Leave your suitcase in the car.  After surgery it may be brought to your room.  For patients admitted to the hospital, discharge time will be determined by your treatment team.  Patients discharged the day of surgery will not be allowed to drive home.    Special instructions:  [facility] - Preparing for Surgery  Before surgery, you can play an important role.  Because skin is not sterile, your skin needs to be as free of germs as possible.  You can reduce the number of germs on you skin by washing with CHG (chlorahexidine gluconate) soap before surgery.  CHG is an antiseptic cleaner which kills germs and bonds with the skin to continue killing germs even after washing.  Please DO NOT use if you have an allergy to CHG or antibacterial soaps.  If your skin becomes reddened/irritated stop using the CHG and inform your nurse when you arrive at Short Stay.  Do not  shave (including legs and underarms) for at least 48 hours prior to the first CHG shower.  You may shave your face.  Please follow these instructions carefully:   1.  Shower with CHG Soap the night before surgery and the                                morning of Surgery.  2.  If you choose to wash your hair, wash your hair first as usual with your       normal shampoo.  3.  After you shampoo, rinse your hair and body thoroughly to remove the                      Shampoo.  4.  Use CHG as you would any other liquid soap.  You can apply chg directly       to the skin and wash gently with scrungie or a clean washcloth.  5.  Apply the CHG Soap to your body ONLY FROM THE NECK DOWN.        Do not use on open wounds or open sores.  Avoid contact with your eyes,       ears, mouth and genitals (private parts).  Wash genitals (private parts)       with your normal soap.  6.  Wash thoroughly, paying  special attention to the area where your surgery        will be performed.  7.  Thoroughly rinse your body with warm water from the neck down.  8.  DO NOT shower/wash with your normal soap after using and rinsing off       the CHG Soap.  9.  Pat yourself dry with a clean towel.            10.  Wear clean pajamas.            11.  Place clean sheets on your bed the night of your first shower and do not        sleep with pets.  Day of Surgery  Do not apply any lotions/deoderants the morning of surgery.  Please wear clean clothes to the hospital/surgery center.    Please read over the following fact sheets that you were given. Pain Booklet, Coughing and Deep Breathing, Blood Transfusion Information, MRSA Information and Surgical Site Infection Prevention

## 2015-09-24 NOTE — Progress Notes (Addendum)
Cardiologist denies   Medical Md is Dr.Robert Ehinger  Echo done when she turned 65  Stress test denies ever having one  Heart cath denies  EKG denies in past yr  CXR denies in past yr

## 2015-09-30 MED ORDER — CEFAZOLIN SODIUM-DEXTROSE 2-3 GM-% IV SOLR
2.0000 g | INTRAVENOUS | Status: AC
  Administered 2015-10-01 (×2): 2 g via INTRAVENOUS
  Filled 2015-09-30: qty 50

## 2015-10-01 ENCOUNTER — Inpatient Hospital Stay (HOSPITAL_COMMUNITY): Payer: BLUE CROSS/BLUE SHIELD | Admitting: Anesthesiology

## 2015-10-01 ENCOUNTER — Inpatient Hospital Stay (HOSPITAL_COMMUNITY): Payer: BLUE CROSS/BLUE SHIELD

## 2015-10-01 ENCOUNTER — Encounter (HOSPITAL_COMMUNITY)
Admission: RE | Disposition: A | Discharge status: Home / Self Care | Payer: BLUE CROSS/BLUE SHIELD | Source: Ambulatory Visit | Attending: Neurosurgery

## 2015-10-01 ENCOUNTER — Encounter (HOSPITAL_COMMUNITY): Payer: Self-pay | Admitting: *Deleted

## 2015-10-01 ENCOUNTER — Inpatient Hospital Stay (HOSPITAL_COMMUNITY)
Admission: RE | Admit: 2015-10-01 | Discharge: 2015-10-03 | DRG: 460 | Disposition: A | Discharge status: Home / Self Care | Payer: BLUE CROSS/BLUE SHIELD | Source: Ambulatory Visit | Attending: Neurosurgery | Admitting: Neurosurgery

## 2015-10-01 DIAGNOSIS — M549 Dorsalgia, unspecified: Secondary | ICD-10-CM

## 2015-10-01 DIAGNOSIS — M4316 Spondylolisthesis, lumbar region: Secondary | ICD-10-CM | POA: Diagnosis present

## 2015-10-01 DIAGNOSIS — Z87891 Personal history of nicotine dependence: Secondary | ICD-10-CM

## 2015-10-01 DIAGNOSIS — Z886 Allergy status to analgesic agent status: Secondary | ICD-10-CM

## 2015-10-01 DIAGNOSIS — M4806 Spinal stenosis, lumbar region: Principal | ICD-10-CM | POA: Diagnosis present

## 2015-10-01 DIAGNOSIS — M48062 Spinal stenosis, lumbar region with neurogenic claudication: Secondary | ICD-10-CM | POA: Diagnosis present

## 2015-10-01 DIAGNOSIS — R110 Nausea: Secondary | ICD-10-CM | POA: Diagnosis not present

## 2015-10-01 DIAGNOSIS — Z91038 Other insect allergy status: Secondary | ICD-10-CM

## 2015-10-01 DIAGNOSIS — Z79899 Other long term (current) drug therapy: Secondary | ICD-10-CM | POA: Diagnosis not present

## 2015-10-01 DIAGNOSIS — I10 Essential (primary) hypertension: Secondary | ICD-10-CM | POA: Diagnosis present

## 2015-10-01 SURGERY — POSTERIOR LUMBAR FUSION 1 LEVEL
Anesthesia: General | Site: Back | Laterality: Bilateral

## 2015-10-01 MED ORDER — LIDOCAINE HCL (CARDIAC) 20 MG/ML IV SOLN
INTRAVENOUS | Status: DC | PRN
  Administered 2015-10-01: 100 mg via INTRAVENOUS

## 2015-10-01 MED ORDER — PHENOL 1.4 % MT LIQD: 1.0000 | OROMUCOSAL | Status: DC | PRN

## 2015-10-01 MED ORDER — SODIUM CHLORIDE 0.9% FLUSH
3.0000 mL | Freq: Two times a day (BID) | INTRAVENOUS | Status: DC
  Administered 2015-10-02: 3 mL via INTRAVENOUS

## 2015-10-01 MED ORDER — CYCLOBENZAPRINE HCL 10 MG PO TABS
10.0000 mg | ORAL_TABLET | Freq: Three times a day (TID) | ORAL | Status: DC | PRN
  Administered 2015-10-01 – 2015-10-02 (×3): 10 mg via ORAL
  Filled 2015-10-01 (×4): qty 1

## 2015-10-01 MED ORDER — HYDROMORPHONE HCL 1 MG/ML IJ SOLN
0.5000 mg | INTRAMUSCULAR | Status: DC | PRN
  Administered 2015-10-01 (×2): 0.5 mg via INTRAVENOUS

## 2015-10-01 MED ORDER — MIDAZOLAM HCL 2 MG/2ML IJ SOLN
INTRAMUSCULAR | Status: AC
  Filled 2015-10-01: qty 2

## 2015-10-01 MED ORDER — ACETAMINOPHEN 325 MG PO TABS: 650.0000 mg | ORAL_TABLET | ORAL | Status: DC | PRN

## 2015-10-01 MED ORDER — ONDANSETRON HCL 4 MG/2ML IJ SOLN
INTRAMUSCULAR | Status: DC | PRN
  Administered 2015-10-01: 4 mg via INTRAVENOUS

## 2015-10-01 MED ORDER — ALUM & MAG HYDROXIDE-SIMETH 200-200-20 MG/5ML PO SUSP
30.0000 mL | Freq: Four times a day (QID) | ORAL | Status: DC | PRN
  Administered 2015-10-01 – 2015-10-02 (×2): 30 mL via ORAL
  Filled 2015-10-01 (×2): qty 30

## 2015-10-01 MED ORDER — VANCOMYCIN HCL 1000 MG IV SOLR
INTRAVENOUS | Status: AC
  Filled 2015-10-01: qty 1000

## 2015-10-01 MED ORDER — SUGAMMADEX SODIUM 200 MG/2ML IV SOLN
INTRAVENOUS | Status: AC
  Filled 2015-10-01: qty 2

## 2015-10-01 MED ORDER — CEFAZOLIN SODIUM-DEXTROSE 2-3 GM-% IV SOLR
INTRAVENOUS | Status: AC
  Filled 2015-10-01: qty 50

## 2015-10-01 MED ORDER — MIDAZOLAM HCL 5 MG/5ML IJ SOLN
INTRAMUSCULAR | Status: DC | PRN
  Administered 2015-10-01: 2 mg via INTRAVENOUS

## 2015-10-01 MED ORDER — OXYCODONE-ACETAMINOPHEN 5-325 MG PO TABS
1.0000 | ORAL_TABLET | ORAL | Status: DC | PRN
  Administered 2015-10-01 – 2015-10-03 (×9): 2 via ORAL
  Filled 2015-10-01 (×9): qty 2

## 2015-10-01 MED ORDER — BENAZEPRIL HCL 10 MG PO TABS
10.0000 mg | ORAL_TABLET | Freq: Every day | ORAL | Status: DC
  Administered 2015-10-02: 10 mg via ORAL
  Filled 2015-10-01 (×3): qty 1

## 2015-10-01 MED ORDER — SODIUM CHLORIDE 0.9% FLUSH: 3.0000 mL | INTRAVENOUS | Status: DC | PRN

## 2015-10-01 MED ORDER — MENTHOL 3 MG MT LOZG: 1.0000 | LOZENGE | OROMUCOSAL | Status: DC | PRN

## 2015-10-01 MED ORDER — BISACODYL 10 MG RE SUPP: 10.0000 mg | Freq: Every day | RECTAL | Status: DC | PRN

## 2015-10-01 MED ORDER — DULOXETINE HCL 60 MG PO CPEP
60.0000 mg | ORAL_CAPSULE | Freq: Every day | ORAL | Status: DC
  Administered 2015-10-02: 60 mg via ORAL
  Filled 2015-10-01 (×3): qty 1

## 2015-10-01 MED ORDER — PHENYLEPHRINE HCL 10 MG/ML IJ SOLN
10.0000 mg | INTRAVENOUS | Status: DC | PRN
  Administered 2015-10-01: 25 ug/min via INTRAVENOUS

## 2015-10-01 MED ORDER — FENTANYL CITRATE (PF) 100 MCG/2ML IJ SOLN
25.0000 ug | INTRAMUSCULAR | Status: DC | PRN
  Administered 2015-10-01 (×3): 50 ug via INTRAVENOUS

## 2015-10-01 MED ORDER — PROMETHAZINE HCL 25 MG/ML IJ SOLN
6.2500 mg | INTRAMUSCULAR | Status: DC | PRN
  Administered 2015-10-01: 6.25 mg via INTRAVENOUS

## 2015-10-01 MED ORDER — LACTATED RINGERS IV SOLN
INTRAVENOUS | Status: DC | PRN
  Administered 2015-10-01 (×2): via INTRAVENOUS

## 2015-10-01 MED ORDER — OXYCODONE HCL 5 MG PO TABS: 5.0000 mg | ORAL_TABLET | ORAL | Status: DC | PRN

## 2015-10-01 MED ORDER — DEXAMETHASONE SODIUM PHOSPHATE 4 MG/ML IJ SOLN
INTRAMUSCULAR | Status: DC | PRN
  Administered 2015-10-01: 4 mg via INTRAVENOUS

## 2015-10-01 MED ORDER — 0.9 % SODIUM CHLORIDE (POUR BTL) OPTIME
TOPICAL | Status: DC | PRN
  Administered 2015-10-01: 1000 mL

## 2015-10-01 MED ORDER — FENTANYL CITRATE (PF) 250 MCG/5ML IJ SOLN
INTRAMUSCULAR | Status: AC
  Filled 2015-10-01: qty 5

## 2015-10-01 MED ORDER — MEPERIDINE HCL 25 MG/ML IJ SOLN: 6.2500 mg | INTRAMUSCULAR | Status: DC | PRN

## 2015-10-01 MED ORDER — ACETAMINOPHEN 650 MG RE SUPP: 650.0000 mg | RECTAL | Status: DC | PRN

## 2015-10-01 MED ORDER — SODIUM CHLORIDE 0.9 % IV SOLN: 250.0000 mL | INTRAVENOUS | Status: DC

## 2015-10-01 MED ORDER — ONDANSETRON HCL 4 MG/2ML IJ SOLN
INTRAMUSCULAR | Status: AC
  Filled 2015-10-01: qty 2

## 2015-10-01 MED ORDER — ROCURONIUM BROMIDE 50 MG/5ML IV SOLN
INTRAVENOUS | Status: AC
  Filled 2015-10-01: qty 3

## 2015-10-01 MED ORDER — PROPOFOL 10 MG/ML IV BOLUS
INTRAVENOUS | Status: DC | PRN
  Administered 2015-10-01: 200 mg via INTRAVENOUS

## 2015-10-01 MED ORDER — PHENYLEPHRINE HCL 10 MG/ML IJ SOLN
INTRAMUSCULAR | Status: DC | PRN
  Administered 2015-10-01: 40 ug via INTRAVENOUS
  Administered 2015-10-01: 120 ug via INTRAVENOUS
  Administered 2015-10-01 (×3): 80 ug via INTRAVENOUS

## 2015-10-01 MED ORDER — PROPOFOL 10 MG/ML IV BOLUS
INTRAVENOUS | Status: AC
  Filled 2015-10-01: qty 20

## 2015-10-01 MED ORDER — SURGIFOAM 100 EX MISC
CUTANEOUS | Status: DC | PRN
  Administered 2015-10-01: 20 mL via TOPICAL

## 2015-10-01 MED ORDER — FENTANYL CITRATE (PF) 100 MCG/2ML IJ SOLN
INTRAMUSCULAR | Status: AC
  Filled 2015-10-01: qty 2

## 2015-10-01 MED ORDER — HYDROXYZINE HCL 25 MG PO TABS
50.0000 mg | ORAL_TABLET | ORAL | Status: DC | PRN
  Administered 2015-10-02: 50 mg via ORAL
  Filled 2015-10-01: qty 2

## 2015-10-01 MED ORDER — HYDROXYZINE HCL 50 MG/ML IM SOLN
50.0000 mg | INTRAMUSCULAR | Status: DC | PRN
  Administered 2015-10-01: 50 mg via INTRAMUSCULAR
  Filled 2015-10-01 (×2): qty 1

## 2015-10-01 MED ORDER — HYDROMORPHONE HCL 1 MG/ML IJ SOLN
INTRAMUSCULAR | Status: DC | PRN
  Administered 2015-10-01 (×2): 0.5 mg via INTRAVENOUS

## 2015-10-01 MED ORDER — PROMETHAZINE HCL 25 MG/ML IJ SOLN
12.5000 mg | Freq: Four times a day (QID) | INTRAMUSCULAR | Status: DC | PRN
  Administered 2015-10-01 – 2015-10-02 (×2): 12.5 mg via INTRAVENOUS
  Filled 2015-10-01 (×2): qty 1

## 2015-10-01 MED ORDER — LIDOCAINE-EPINEPHRINE 1 %-1:100000 IJ SOLN
INTRAMUSCULAR | Status: DC | PRN
  Administered 2015-10-01: 20 mL

## 2015-10-01 MED ORDER — ROCURONIUM BROMIDE 100 MG/10ML IV SOLN
INTRAVENOUS | Status: DC | PRN
  Administered 2015-10-01: 20 mg via INTRAVENOUS
  Administered 2015-10-01: 30 mg via INTRAVENOUS
  Administered 2015-10-01: 20 mg via INTRAVENOUS
  Administered 2015-10-01: 10 mg via INTRAVENOUS
  Administered 2015-10-01: 50 mg via INTRAVENOUS
  Administered 2015-10-01: 10 mg via INTRAVENOUS

## 2015-10-01 MED ORDER — BUPIVACAINE HCL (PF) 0.5 % IJ SOLN
INTRAMUSCULAR | Status: DC | PRN
  Administered 2015-10-01: 20 mL

## 2015-10-01 MED ORDER — ONDANSETRON HCL 4 MG PO TABS
4.0000 mg | ORAL_TABLET | Freq: Four times a day (QID) | ORAL | Status: DC | PRN
  Administered 2015-10-01: 8 mg via ORAL
  Filled 2015-10-01: qty 2

## 2015-10-01 MED ORDER — ACETAMINOPHEN 10 MG/ML IV SOLN
INTRAVENOUS | Status: AC
  Administered 2015-10-01: 1000 mg via INTRAVENOUS
  Filled 2015-10-01: qty 100

## 2015-10-01 MED ORDER — HYDROCODONE-ACETAMINOPHEN 5-325 MG PO TABS: 1.0000 | ORAL_TABLET | ORAL | Status: DC | PRN

## 2015-10-01 MED ORDER — LIDOCAINE HCL (CARDIAC) 20 MG/ML IV SOLN
INTRAVENOUS | Status: AC
  Filled 2015-10-01: qty 5

## 2015-10-01 MED ORDER — HYDROMORPHONE HCL 1 MG/ML IJ SOLN
INTRAMUSCULAR | Status: AC
  Filled 2015-10-01: qty 1

## 2015-10-01 MED ORDER — PROMETHAZINE HCL 25 MG/ML IJ SOLN
INTRAMUSCULAR | Status: AC
  Filled 2015-10-01: qty 1

## 2015-10-01 MED ORDER — FENTANYL CITRATE (PF) 100 MCG/2ML IJ SOLN
INTRAMUSCULAR | Status: DC | PRN
  Administered 2015-10-01 (×4): 50 ug via INTRAVENOUS
  Administered 2015-10-01: 100 ug via INTRAVENOUS
  Administered 2015-10-01 (×4): 50 ug via INTRAVENOUS

## 2015-10-01 MED ORDER — MAGNESIUM HYDROXIDE 400 MG/5ML PO SUSP: 30.0000 mL | Freq: Every day | ORAL | Status: DC | PRN

## 2015-10-01 MED ORDER — AMLODIPINE BESYLATE 2.5 MG PO TABS
2.5000 mg | ORAL_TABLET | Freq: Every day | ORAL | Status: DC
  Administered 2015-10-02: 2.5 mg via ORAL
  Filled 2015-10-01 (×3): qty 1

## 2015-10-01 MED ORDER — MORPHINE SULFATE (PF) 4 MG/ML IV SOLN
4.0000 mg | INTRAVENOUS | Status: DC | PRN
  Administered 2015-10-01 – 2015-10-02 (×4): 4 mg via INTRAMUSCULAR
  Filled 2015-10-01 (×4): qty 1

## 2015-10-01 MED ORDER — SODIUM CHLORIDE 0.9 % IR SOLN
Status: DC | PRN
  Administered 2015-10-01: 500 mL

## 2015-10-01 MED ORDER — AMLODIPINE BESY-BENAZEPRIL HCL 2.5-10 MG PO CAPS: 1.0000 | ORAL_CAPSULE | Freq: Every day | ORAL | Status: DC

## 2015-10-01 MED ORDER — PHENYLEPHRINE 40 MCG/ML (10ML) SYRINGE FOR IV PUSH (FOR BLOOD PRESSURE SUPPORT)
PREFILLED_SYRINGE | INTRAVENOUS | Status: AC
  Filled 2015-10-01: qty 10

## 2015-10-01 MED ORDER — SUGAMMADEX SODIUM 200 MG/2ML IV SOLN
INTRAVENOUS | Status: DC | PRN
  Administered 2015-10-01: 200 mg via INTRAVENOUS

## 2015-10-01 MED ORDER — KCL IN DEXTROSE-NACL 20-5-0.45 MEQ/L-%-% IV SOLN
INTRAVENOUS | Status: DC
  Administered 2015-10-01: 17:00:00 via INTRAVENOUS
  Filled 2015-10-01: qty 1000

## 2015-10-01 MED ORDER — ONDANSETRON HCL 4 MG/2ML IJ SOLN
4.0000 mg | Freq: Four times a day (QID) | INTRAMUSCULAR | Status: DC | PRN
  Administered 2015-10-02: 8 mg via INTRAVENOUS
  Filled 2015-10-01: qty 4

## 2015-10-01 MED ORDER — LACTATED RINGERS IV SOLN: INTRAVENOUS | Status: DC

## 2015-10-01 MED FILL — Sodium Chloride IV Soln 0.9%: INTRAVENOUS | Qty: 1000 | Status: AC

## 2015-10-01 MED FILL — Heparin Sodium (Porcine) Inj 1000 Unit/ML: INTRAMUSCULAR | Qty: 30 | Status: AC

## 2015-10-01 SURGICAL SUPPLY — 73 items
ADH SKN CLS APL DERMABOND .7 (GAUZE/BANDAGES/DRESSINGS) ×2
ADH SKN CLS LQ APL DERMABOND (GAUZE/BANDAGES/DRESSINGS) ×1
BAG DECANTER FOR FLEXI CONT (MISCELLANEOUS) ×2 IMPLANT
BLADE CLIPPER SURG (BLADE) IMPLANT
BRUSH SCRUB EZ PLAIN DRY (MISCELLANEOUS) ×2 IMPLANT
BUR ACRON 5.0MM COATED (BURR) ×2 IMPLANT
BUR MATCHSTICK NEURO 3.0 LAGG (BURR) ×2 IMPLANT
CANISTER SUCT 3000ML PPV (MISCELLANEOUS) ×2 IMPLANT
CAP LCK SPNE (Orthopedic Implant) ×4 IMPLANT
CAP LOCK SPINE RADIUS (Orthopedic Implant) IMPLANT
CAP LOCKING (Orthopedic Implant) ×8 IMPLANT
CONT SPEC 4OZ CLIKSEAL STRL BL (MISCELLANEOUS) ×2 IMPLANT
COVER BACK TABLE 60X90IN (DRAPES) ×2 IMPLANT
DERMABOND ADHESIVE PROPEN (GAUZE/BANDAGES/DRESSINGS) ×1
DERMABOND ADVANCED (GAUZE/BANDAGES/DRESSINGS) ×2
DERMABOND ADVANCED .7 DNX12 (GAUZE/BANDAGES/DRESSINGS) ×2 IMPLANT
DERMABOND ADVANCED .7 DNX6 (GAUZE/BANDAGES/DRESSINGS) IMPLANT
DRAPE C-ARM 42X72 X-RAY (DRAPES) ×4 IMPLANT
DRAPE LAPAROTOMY 100X72X124 (DRAPES) ×2 IMPLANT
DRAPE POUCH INSTRU U-SHP 10X18 (DRAPES) ×2 IMPLANT
DRAPE PROXIMA HALF (DRAPES) IMPLANT
DRSG EMULSION OIL 3X3 NADH (GAUZE/BANDAGES/DRESSINGS) IMPLANT
ELECT REM PT RETURN 9FT ADLT (ELECTROSURGICAL) ×2
ELECTRODE REM PT RTRN 9FT ADLT (ELECTROSURGICAL) ×1 IMPLANT
GAUZE SPONGE 4X4 12PLY STRL (GAUZE/BANDAGES/DRESSINGS) ×2 IMPLANT
GAUZE SPONGE 4X4 16PLY XRAY LF (GAUZE/BANDAGES/DRESSINGS) IMPLANT
GLOVE BIOGEL PI IND STRL 8 (GLOVE) ×2 IMPLANT
GLOVE BIOGEL PI INDICATOR 8 (GLOVE) ×2
GLOVE ECLIPSE 7.5 STRL STRAW (GLOVE) ×4 IMPLANT
GLOVE EXAM NITRILE LRG STRL (GLOVE) IMPLANT
GLOVE EXAM NITRILE MD LF STRL (GLOVE) IMPLANT
GLOVE EXAM NITRILE XL STR (GLOVE) IMPLANT
GLOVE EXAM NITRILE XS STR PU (GLOVE) IMPLANT
GOWN STRL REUS W/ TWL LRG LVL3 (GOWN DISPOSABLE) ×1 IMPLANT
GOWN STRL REUS W/ TWL XL LVL3 (GOWN DISPOSABLE) ×1 IMPLANT
GOWN STRL REUS W/TWL 2XL LVL3 (GOWN DISPOSABLE) IMPLANT
GOWN STRL REUS W/TWL LRG LVL3 (GOWN DISPOSABLE) ×4
GOWN STRL REUS W/TWL XL LVL3 (GOWN DISPOSABLE) ×6
KIT BASIN OR (CUSTOM PROCEDURE TRAY) ×2 IMPLANT
KIT INFUSE SMALL (Orthopedic Implant) ×1 IMPLANT
KIT ROOM TURNOVER OR (KITS) ×2 IMPLANT
MILL MEDIUM DISP (BLADE) ×2 IMPLANT
NDL ASP BONE MRW 8GX15 (NEEDLE) IMPLANT
NDL SPNL 18GX3.5 QUINCKE PK (NEEDLE) IMPLANT
NDL SPNL 22GX3.5 QUINCKE BK (NEEDLE) ×2 IMPLANT
NEEDLE ASP BONE MRW 8GX15 (NEEDLE) ×2 IMPLANT
NEEDLE BONE MARROW 8GAX6 (NEEDLE) IMPLANT
NEEDLE SPNL 18GX3.5 QUINCKE PK (NEEDLE) IMPLANT
NEEDLE SPNL 22GX3.5 QUINCKE BK (NEEDLE) ×4 IMPLANT
NS IRRIG 1000ML POUR BTL (IV SOLUTION) ×2 IMPLANT
PACK LAMINECTOMY NEURO (CUSTOM PROCEDURE TRAY) ×2 IMPLANT
PAD ARMBOARD 7.5X6 YLW CONV (MISCELLANEOUS) ×6 IMPLANT
PATTIES SURGICAL .5 X.5 (GAUZE/BANDAGES/DRESSINGS) IMPLANT
PATTIES SURGICAL .5 X1 (DISPOSABLE) IMPLANT
PATTIES SURGICAL 1X1 (DISPOSABLE) IMPLANT
PEEK PLIF AVS 8X25X4 DEGREE (Peek) ×2 IMPLANT
ROD 5.5X30MM (Rod) ×1 IMPLANT
ROD RADIUS 35MM (Rod) ×1 IMPLANT
SCREW 5.75 X 635 (Screw) ×4 IMPLANT
SPONGE LAP 4X18 X RAY DECT (DISPOSABLE) IMPLANT
SPONGE NEURO XRAY DETECT 1X3 (DISPOSABLE) IMPLANT
SPONGE SURGIFOAM ABS GEL 100 (HEMOSTASIS) ×2 IMPLANT
STRIP BIOACTIVE VITOSS 25X100X (Neuro Prosthesis/Implant) ×1 IMPLANT
STRIP BIOACTIVE VITOSS 25X52X4 (Orthopedic Implant) ×1 IMPLANT
SUT VIC AB 1 CT1 18XBRD ANBCTR (SUTURE) ×2 IMPLANT
SUT VIC AB 1 CT1 8-18 (SUTURE) ×4
SUT VIC AB 2-0 CP2 18 (SUTURE) ×5 IMPLANT
SYR 3ML LL SCALE MARK (SYRINGE) ×8 IMPLANT
SYR CONTROL 10ML LL (SYRINGE) ×2 IMPLANT
TOWEL OR 17X24 6PK STRL BLUE (TOWEL DISPOSABLE) ×2 IMPLANT
TOWEL OR 17X26 10 PK STRL BLUE (TOWEL DISPOSABLE) ×2 IMPLANT
TRAY FOLEY W/METER SILVER 14FR (SET/KITS/TRAYS/PACK) ×2 IMPLANT
WATER STERILE IRR 1000ML POUR (IV SOLUTION) ×2 IMPLANT

## 2015-10-01 NOTE — H&P (Signed)
Subjective: Patient is a 55 y.o. 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 Hematocrit she's been having right lumbar radiculopathy. She's been treated with extensive nonsurgical measures including extensive pain management, periodic spinal injections, physical therapy, etc. Patient admitted for an L4-5 lumbar decompression, posterior lumbar interbody arthrodesis with interbody implants and bone graft, and posterior lateral arthrodesis with posterior instrumentation and bone graft.   Patient Active Problem List   Diagnosis Date Noted  . Ulcerative proctitis ([facility]) 08/02/2015  . PERSISTENT DISORDER INITIATING/MAINTAINING SLEEP 07/30/2010  . HYPERTENSION 07/18/2010  . INSOMNIA 07/18/2010   Past Medical History  Diagnosis Date  . Migraines     better with controlling hypertension  . Ulcerative proctitis ([facility])     suppository as needed for flare ups  . 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 Internal and external hemorrhoids without complication   . Hypertension     takes Lotrel daily  . Muscle spasm     takes Flexeril daily as needed  . History of bronchitis 2 yrs ago  . Arthritis     spine/hands  . Chronic back pain     spondylolisthesis  . History of shingles     Past Surgical History  Procedure Laterality Date  . Spinal cyst aspiration      back and right side of face  . Bunionectomy Left   . Anterior cruciate ligament repair Left   . Breast enhancement surgery      replaced a second time 9/11  . Colonoscopy w/ biopsies  08/17/2009    Prescriptions prior to admission  Medication Sig Dispense Refill Last Dose  . amlodipine-benazepril (LOTREL) 2.5-10 MG per capsule Take 1 capsule by mouth daily.   09/30/2015 at Unknown time  . Calcium-Magnesium-Vitamin D (CALCIUM 500 PO) Take by mouth daily.   Past Week at Unknown time  . cyclobenzaprine (FLEXERIL) 5  MG tablet Take 5 mg by mouth 3 (three) times daily as needed for muscle spasms.    Past Week at Unknown time  . diclofenac sodium (VOLTAREN) 1 % GEL Apply 2 g topically as needed (for pain).    Past Week at Unknown time  . DULoxetine (CYMBALTA) 60 MG capsule Take 60 mg by mouth daily.   09/30/2015 at Unknown time  . HYDROcodone Bitartrate (ZOHYDRO ER) 40 MG C12A Take 1 tablet by mouth 2 (two) times daily.   10/01/2015 at 0500  . Multiple Vitamin (MULTIVITAMIN) tablet Take 1 tablet by mouth daily.   Past Week at Unknown time  . oxyCODONE-acetaminophen (PERCOCET) 10-325 MG tablet Take 1 tablet by mouth every 4 (four) hours as needed for pain.   09/30/2015 at Unknown time  . VITAMIN D, CHOLECALCIFEROL, PO Take 4,000 Units by mouth daily.   Past Week at Unknown time  . DENTA 5000 PLUS 1.1 % CREA dental cream Place 1 application onto teeth at bedtime.    Taking  . mesalamine (CANASA) 1000 MG suppository Place 1,000 mg rectally daily as needed. For flare ups   More than a month at Unknown time  . mupirocin ointment (BACTROBAN) 2 % Place 1 application into the nose 2 (two) times daily. (Patient not taking: Reported on 09/21/2015) 22 g 1 Taking  . triamcinolone (KENALOG) 0.1 % paste    Taking   Allergies  Allergen Reactions  . Aspirin   . Bee Venom Hives    Yellow jacket stings  .  Nsaids     UC flare    Social History  Substance Use Topics  . Smoking status: Former Research scientist (life sciences)  . Smokeless tobacco: Never Used     Comment: quit smoking 30 yrs ago  . Alcohol Use: No    Family History  Problem Relation Age of Onset  . Lymphoma Mother 87  . Parkinson's disease Father   . Hypertension Father      Review of Systems A comprehensive review of systems was negative.  Objective: Vital signs in last 24 hours: Temp:  [98.3 F (36.8 C)] 98.3 F (36.8 C) (01/30 0611) Pulse Rate:  [74] 74 (01/30 0611) Resp:  [18] 18 (01/30 0611) BP: (118)/(75) 118/75 mmHg (01/30 0611) SpO2:  [96 %] 96 % (01/30  0611) Weight:  [61.236 kg (135 lb)] 61.236 kg (135 lb) (01/30 0601)  EXAM: Patient well-developed well-nourished white female in no acute distress. Lungs are clear to auscultation , the patient has symmetrical respiratory excursion. Heart has a regular rate and rhythm normal S1 and S2 no murmur.   Abdomen is soft nontender nondistended bowel sounds are present. Extremity examination shows no clubbing cyanosis or edema. Motor examination shows 5 over 5 strength in the lower extremities including the iliopsoas quadriceps dorsiflexor extensor hallicus  longus and plantar flexor bilaterally. Sensation is intact to pinprick in the distal lower extremities. Reflexes are symmetrical bilaterally. No pathologic reflexes are present. Patient has a normal gait and stance.  Data Review:CBC    Component Value Date/Time   WBC 6.0 09/24/2015 1608   RBC 4.45 09/24/2015 1608   HGB 12.9 09/24/2015 1608   HCT 38.9 09/24/2015 1608   PLT 192 09/24/2015 1608   MCV 87.4 09/24/2015 1608   MCH 29.0 09/24/2015 1608   MCHC 33.2 09/24/2015 1608   RDW 12.6 09/24/2015 1608                          BMET    Component Value Date/Time   NA 138 09/24/2015 1608   K 4.4 09/24/2015 1608   CL 102 09/24/2015 1608   CO2 24 09/24/2015 1608   GLUCOSE 88 09/24/2015 1608   BUN 7 09/24/2015 1608   CREATININE 0.67 09/24/2015 1608   CALCIUM 9.7 09/24/2015 1608   GFRNONAA >60 09/24/2015 1608   GFRAA >60 09/24/2015 1608     Assessment/Plan: Patient with right-sided low back pain, radiating down to the right buttock, lateral right hip and thigh, secondary to right L4-5 lateral recess stenosis with associated dynamic degenerative grade 2 spondylolisthesis. She is admitted for a L4-5 lumbar decompression and arthrodesis.  I've discussed with the patient the nature of his condition, the nature the surgical procedure, the typical length of surgery, hospital stay, and overall recuperation, the limitations postoperatively, and risks of  surgery. I discussed risks including risks of infection, bleeding, possibly need for transfusion, the risk of nerve root dysfunction with pain, weakness, numbness, or paresthesias, the risk of dural tear and CSF leakage and possible need for further surgery, the risk of failure of the arthrodesis and possibly for further surgery, the risk of anesthetic complications including myocardial infarction, stroke, pneumonia, and death. We discussed the need for postoperative immobilization in a lumbar brace. Understanding all this the patient does wish to proceed with surgery and is admitted for such.     Hosie Spangle, MD 10/01/2015 7:26 AM

## 2015-10-01 NOTE — Anesthesia Preprocedure Evaluation (Addendum)
Anesthesia Evaluation  Patient identified by MRN, date of birth, ID band Patient awake    Reviewed: Allergy & Precautions, NPO status , Patient's Chart, lab work & pertinent test results  Airway Mallampati: II  TM Distance: >3 FB Neck ROM: Full    Dental no notable dental hx.    Pulmonary former smoker,    Pulmonary exam normal breath sounds clear to auscultation       Cardiovascular hypertension, Pt. on medications Normal cardiovascular exam Rhythm:Regular Rate:Normal     Neuro/Psych  Headaches, negative neurological ROS  negative psych ROS   GI/Hepatic negative GI ROS, Neg liver ROS,   Endo/Other  negative endocrine ROS  Renal/GU negative Renal ROS  negative genitourinary   Musculoskeletal negative musculoskeletal ROS (+)   Abdominal   Peds negative pediatric ROS (+)  Hematology negative hematology ROS (+)   Anesthesia Other Findings   Reproductive/Obstetrics negative OB ROS                            Anesthesia Physical Anesthesia Plan  ASA: II  Anesthesia Plan: General   Post-op Pain Management:    Induction: Intravenous  Airway Management Planned: Oral ETT  Additional Equipment:   Intra-op Plan:   Post-operative Plan: Extubation in OR  Informed Consent: I have reviewed the patients History and Physical, chart, labs and discussed the procedure including the risks, benefits and alternatives for the proposed anesthesia with the patient or authorized representative who has indicated his/her understanding and acceptance.   Dental advisory given  Plan Discussed with: CRNA  Anesthesia Plan Comments:         Anesthesia Quick Evaluation

## 2015-10-01 NOTE — Progress Notes (Signed)
Filed Vitals:   10/01/15 1315 10/01/15 1330 10/01/15 1405 10/01/15 1719  BP: 126/73 122/78 116/63 136/81  Pulse: 75 71  65  Temp:  98.1 F (36.7 C) 98 F (36.7 C) 98.3 F (36.8 C)  TempSrc:      Resp: 16 13 18 18   Height:      Weight:      SpO2: 100% 100% 100% 100%    Patient with a lot of postoperative nausea. Very little by mouth intake. No response to Vistaril IM, given Phenergan IV. Continuing IVF. We will leave Foley in until up and ambulating actively. We'll leave IV running until taking well by mouth. Dressing clean and dry. Moving all 4 extremities well.  Plan: Continue treatment of postoperative nausea. We'll mobilize once nausea subsides (turning in bed and changing of position aggravates nausea).  Hosie Spangle, MD 10/01/2015, 6:22 PM

## 2015-10-01 NOTE — Anesthesia Postprocedure Evaluation (Signed)
Anesthesia Post Note  Patient: Shawna Dean  Procedure(s) Performed: Procedure(s) (LRB): POSTERIOR LUMBAR FUSION 1 LEVEL (Bilateral)  Patient location during evaluation: PACU Anesthesia Type: General Level of consciousness: awake and alert Pain management: pain level controlled Vital Signs Assessment: post-procedure vital signs reviewed and stable Respiratory status: spontaneous breathing, nonlabored ventilation, respiratory function stable and patient connected to nasal cannula oxygen Cardiovascular status: blood pressure returned to baseline and stable Postop Assessment: no signs of nausea or vomiting Anesthetic complications: no    Last Vitals:  Filed Vitals:   10/01/15 1200 10/01/15 1215  BP: 125/76 139/76  Pulse: 81 80  Temp:    Resp: 24 14    Last Pain:  Filed Vitals:   10/01/15 1229  PainSc: 8                  Montez Hageman

## 2015-10-01 NOTE — Anesthesia Procedure Notes (Signed)
Procedure Name: Intubation Performed by: Judeth Cornfield T Pre-anesthesia Checklist: Patient identified, Timeout performed, Emergency Drugs available, Suction available and Patient being monitored Patient Re-evaluated:Patient Re-evaluated prior to inductionOxygen Delivery Method: Circle system utilized Preoxygenation: Pre-oxygenation with 100% oxygen Intubation Type: IV induction Ventilation: Mask ventilation without difficulty Laryngoscope Size: Mac and 3 Grade View: Grade I Tube type: Oral Tube size: 7.5 mm Number of attempts: 1 Airway Equipment and Method: Stylet Placement Confirmation: ETT inserted through vocal cords under direct vision,  breath sounds checked- equal and bilateral,  positive ETCO2 and CO2 detector Secured at: 22 cm Tube secured with: Tape Dental Injury: Teeth and Oropharynx as per pre-operative assessment

## 2015-10-01 NOTE — Op Note (Signed)
10/01/2015  11:56 AM  PATIENT:  Shawna Dean  55 y.o. female  PRE-OPERATIVE DIAGNOSIS:  L4-5 dynamic degenerative grade 2 lumbar spondylolisthesis, lumbar stenosis, lumbar spondylosis, lumbar degenerative disc disease, lumbar radiculopathy  POST-OPERATIVE DIAGNOSIS:  L4-5 dynamic degenerative grade 2 lumbar spondylolisthesis, lumbar stenosis, lumbar spondylosis, lumbar degenerative disc disease, lumbar radiculopathy  PROCEDURE:  Procedure(s):  Bilateral L4-5 lumbar decompression including laminectomy, facetectomy, and foraminotomies for the stenotic compression of the exiting L4 and L5 nerve roots bilaterally, with decompression beyond that required for interbody arthrodesis; bilateral L4-5 posterior lumbar interbody arthrodesis with AVS peek interbody implants, Vitoss BA, and infuse; bilateral L4-5 posterior lateral lumbar arthrodesis with nonsegmental radius posterior instrumentation, Vitoss BA, and infuse  SURGEON:  Surgeon(s): Jovita Gamma, MD Newman Pies, MD  ASSISTANTS: Newman Pies, M.D.  ANESTHESIA:   general  EBL:  Total I/O In: 1600 [I.V.:1600] Out: 230 [Urine:130; Blood:100]  BLOOD ADMINISTERED:none  CELL SAVER GIVEN: Cell Saver technician felt that there was insufficient blood loss or process to collect the blood.  COUNT: Correct per nursing staff  DICTATION: Patient is brought to the operating room placed under general endotracheal anesthesia. The patient was turned to prone position the lumbar region was prepped with Betadine soap and solution and draped in a sterile fashion. The midline was infiltrated with local anesthesia with epinephrine. A localizing x-ray was taken and then a midline incision was made carried down through the subcutaneous tissue, bipolar cautery and electrocautery were used to maintain hemostasis. Dissection was carried down to the lumbar fascia. The fascia was incised bilaterally and the paraspinal muscles were dissected with a spinous  process and lamina in a subperiosteal fashion. Care was taken around the patient's previous right L4-5 laminotomy. Another x-ray was taken for localization and the L4-5 level was localized. Dissection was then carried out laterally over the facet complex and the transverse processes of L4-5 were exposed and decorticated.   Decompression was begun with a bilateral L4 and L5 laminectomy using the high-speed drill and Kerrison punches. The margins of the previous right-sided laminotomy were defined. The thecal sac and exiting L4 and L5 nerve roots were identified. Bilateral facetectomies were performed using the high-speed drill and Kerrison punches. The foraminotomies was extended laterally through the neural foramen, decompressing the stenotic compression of the exiting L4 and L5 nerve roots bilaterally.  Once the decompression of the stenotic compression of the thecal sac and exiting nerve roots was completed we proceeded with the posterior lumbar interbody arthrodesis. The annulus was incised bilaterally and the disc space entered. A thorough discectomy was performed using pituitary rongeurs and curettes. Once the discectomy was completed we began to prepare the endplate surfaces removing the cartilaginous endplates surface. We then measured the height of the intervertebral disc space. We selected 8 x 25 x 40 AVS peek interbody implants.  The C-arm fluoroscope was then draped and brought in the field and we identified the pedicle entry points bilaterally at the 4 and L5 levels. Each of the 4 pedicles was probed.  Each of the pedicles was examined with the ball probe good bony surfaces were found and no bony cuts were found. Each of the pedicles was then tapped with a 5.25 mm tap, again examined with the ball probe good threading was found and no bony cuts were found. We then placed 5.75 by 35 millimeter screws bilaterally at each level.  We then packed the AVS peek interbody implants with Vitoss BA, then  placed the first implant and on the right  side, carefully retracting the thecal sac and nerve root medially. We then went back to the left side and packed the midline with additional Vitoss and then placed a second implant and on the left side again retracting the thecal sac and nerve root medially.   We then packed the lateral gutter over the transverse processes and intertransverse space with Vitoss BA. We then selected pre-lordosed rods, using a 30 mm rod on the left, and a 35 mm rod on the right.  They were placed within the screw heads and secured with locking caps once all 4 locking caps were placed final tightening was performed against a counter torque.  The wound had been irrigated multiple times during the procedure with saline solution and bacitracin solution, good hemostasis was established with a combination of bipolar cautery and Gelfoam with thrombin. Once good hemostasis was confirmed we proceeded with closure paraspinal muscles deep fascia and Scarpa's fascia were closed with interrupted undyed 1 Vicryl sutures the subcutaneous and subcuticular closed with interrupted inverted 2-0 undyed Vicryl sutures the skin edges were approximated with Dermabond.  The wound was stressed with sterile gauze and Hypafix.  Following surgery the patient was turned back to the supine position to be reversed and the anesthetic extubated and transferred to the recovery room for further care.   PLAN OF CARE: Admit to inpatient   PATIENT DISPOSITION:  PACU - hemodynamically stable.   Delay start of Pharmacological VTE agent (>24hrs) due to surgical blood loss or risk of bleeding:  yes

## 2015-10-01 NOTE — Transfer of Care (Signed)
Immediate Anesthesia Transfer of Care Note  Patient: Shawna Dean  Procedure(s) Performed: Procedure(s) with comments: POSTERIOR LUMBAR FUSION 1 LEVEL (Bilateral) - L45 decompression with posterior lumbar interbody fusion with interbody prosthesis posterior lateral arthrodesis and posterior nonsegmental instrumentation  Patient Location: PACU  Anesthesia Type:General  Level of Consciousness: awake, patient cooperative and responds to stimulation  Airway & Oxygen Therapy: Patient Spontanous Breathing and Patient connected to nasal cannula oxygen  Post-op Assessment: Report given to RN, Post -op Vital signs reviewed and stable and Patient moving all extremities X 4  Post vital signs: Reviewed and stable  Last Vitals:  Filed Vitals:   10/01/15 0611 10/01/15 1137  BP: 118/75   Pulse: 74   Temp: 36.8 C 36.3 C  Resp: 18     Complications: No apparent anesthesia complications

## 2015-10-02 NOTE — Progress Notes (Signed)
Filed Vitals:   10/01/15 2000 10/01/15 2345 10/02/15 0432 10/02/15 1322  BP: 135/68 144/79 135/76 105/70  Pulse: 71 71 85 93  Temp: 98.9 F (37.2 C) 98.9 F (37.2 C) 98.3 F (36.8 C) 99.3 F (37.4 C)  TempSrc: Oral Oral Oral Oral  Resp: 18 18 16 18   Height:      Weight:      SpO2: 95% 100% 99% 100%     Patient was still having nausea this morning, with very limited by mouth intake, when I saw her on morning rounds.  I discussed situation with the patient and her nurse, and recommended that we begin to slowly mobilize the patient today.  I have since spoken with the patient's nurse and the patient was able to sit up at the bedside, and have a limited amount of breakfast this morning.  Subsequently she again set up for lunch, and then was able to ambulate in the halls.  The plan is to have her ambulate another time in the halls, and then will DC her Foley catheter.  Plan: We'll continue to progress as tolerated through postoperative recovery.  Hosie Spangle, MD 10/02/2015, 3:48 PM

## 2015-10-02 NOTE — Progress Notes (Signed)
Utilization review completed.  

## 2015-10-03 MED ORDER — OXYCODONE HCL 5 MG PO TABS: 5.0000 mg | ORAL_TABLET | ORAL | Status: DC | PRN

## 2015-10-03 NOTE — Progress Notes (Signed)
Pt doing well. Pt and husband given D/C instructions with Rx, verbal understanding was provided. Pt's incision is open to air and is clean and dry with no sign of infection. Pt's IV was removed prior to D/C. Pt D/C'd home via wheelchair @ 1020 per MD order. Pt is stable @ D/C and has no other needs at this time. Holli Humbles, RN

## 2015-10-03 NOTE — Discharge Summary (Signed)
Physician Discharge Summary  Patient ID: Shawna Dean MRN: [id number] DOB/AGE: 12-08-1960 55 y.o.  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 Postoperatively she had a lot of difficulty with nausea and vomiting. That has since settled down, and she is up and ambulating actively in the halls. She is voiding well. Her dressing was removed, and the wound is healing nicely. There is no erythema, swelling, or drainage. We are discharging her to home with instructions regarding wound care and activities. She is scheduled the follow-up with me in 3 weeks. She is going to follow-up with her pain management doctor, Dr. Greta Doom, regarding her pain management, and will continue her usual Zohydro and Percocet 10/325 prescribed by Dr. Greta Doom.  I've given her 1 prescription for OxyIR 5 mg every 4 hours for breakthrough pain, 75 tablets, no refills. Further pain management will be done by Dr. Greta Doom.  Discharge Exam: Blood pressure 99/63, pulse 85, temperature 97.9 F (36.6 C), temperature source Oral, resp. rate 18, height 5\' 2"  (1.575 m), weight 61.236 kg (135 lb), last menstrual period 03/02/2011, SpO2 98 %.  Disposition:    Home     Medication List    TAKE these medications        amlodipine-benazepril 2.5-10 MG capsule  Commonly known as:  LOTREL  Take 1 capsule by mouth daily.     CALCIUM 500 PO  Take by mouth daily.     cyclobenzaprine 5 MG tablet   Commonly known as:  FLEXERIL  Take 5 mg by mouth 3 (three) times daily as needed for muscle spasms.     DENTA 5000 PLUS 1.1 % Crea dental cream  Generic drug:  sodium fluoride  Place 1 application onto teeth at bedtime.     DULoxetine 60 MG capsule  Commonly known as:  CYMBALTA  Take 60 mg by mouth daily.     mesalamine 1000 MG suppository  Commonly known as:  CANASA  Place 1,000 mg rectally daily as needed. For flare ups     multivitamin tablet  Take 1 tablet by mouth daily.     mupirocin ointment 2 %  Commonly known as:  BACTROBAN  Place 1 application into the nose 2 (two) times daily.     oxyCODONE 5 MG immediate release tablet  Commonly known as:  Oxy IR/ROXICODONE  Take 1 tablet (5 mg total) by mouth every 4 (four) hours as needed for breakthrough pain.     oxyCODONE-acetaminophen 10-325 MG tablet  Commonly known as:  PERCOCET  Take 1 tablet by mouth every 4 (four) hours as needed for pain.     triamcinolone 0.1 % paste  Commonly known as:  KENALOG     VITAMIN D (CHOLECALCIFEROL) PO  Take 4,000 Units by mouth daily.     VOLTAREN 1 % Gel  Generic drug:  diclofenac sodium  Apply 2 g topically as needed (for pain).     ZOHYDRO ER 40 MG C12a  Generic drug:  HYDROcodone Bitartrate  Take 1 tablet by mouth 2 (two) times daily.         Signed: Jovita Gamma  W 10/03/2015, 8:26 AM

## 2015-10-03 NOTE — Discharge Instructions (Signed)
Wound Care °Leave incision open to air. °You may shower. °Do not scrub directly on incision.  °Do not put any creams, lotions, or ointments on incision. °Activity °Walk each and every day, increasing distance each day. °No lifting greater than 5 lbs.  Avoid bending, arching, and twisting. °No driving for 2 weeks; may ride as a passenger locally. °If provided with back brace, wear when out of bed.  It is not necessary to wear in bed. °Diet °Resume your normal diet.  °Return to Work °Will be discussed at you follow up appointment. °Call Your Doctor If Any of These Occur °Redness, drainage, or swelling at the wound.  °Temperature greater than 101 degrees. °Severe pain not relieved by pain medication. °Incision starts to come apart. °Follow Up Appt °Call today for appointment in 3 weeks ([phone]) or for problems.  If you have any hardware placed in your spine, you will need an x-ray before your appointment. ° ° °Spinal Fusion °Spinal fusion is a procedure to make 2 or more of the bones in your spinal column (vertebrae) grow together (fuse). This procedure stops movement between the vertebrae and can relieve pain and prevent deformity.  °Spinal fusion is used to treat the following conditions: °· Fractures of the spine. °· Herniated disk (the spongy material [cartilage] between the vertebrae). °· Abnormal curvatures of the spine, such as scoliosis or kyphosis. °· A weak or an unstable spine, caused by infections or tumor. °RISKS AND COMPLICATIONS °Complications associated with spinal fusion are rare, but they can occur. Possible complications include: °· Bleeding. °· Infection near the incision. °· Nerve damage. Signs of nerve damage are back pain, pain in one or both legs, weakness, or numbness. °· Spinal fluid leakage. °· Blood clot in your leg, which can move to your lungs. °· Difficulty controlling urination or bowel movements. °BEFORE THE PROCEDURE °· A medical evaluation will be done. This will include a physical  exam, blood tests, and imaging exams. °· You will talk with an anesthesiologist. This is the person who will be in charge of the anesthesia during the procedure. Spinal fusion usually requires that you are asleep during the procedure (general anesthesia). °· You will need to stop taking certain medicines, particularly those associated with an increased risk of bleeding. Ask your caregiver about changing or stopping your regular medicines. °· If you smoke, you will need to stop at least 2 weeks before the procedure. Smoking can slow down the healing process, especially fusion of the vertebrae, and increase the risk of complications. °· Do not eat or drink anything for at least 8 hours before the procedure. °PROCEDURE  °A cut (incision) is made over the vertebrae that will be fused. The back muscles are separated from the vertebrae. If you are having this procedure to treat a herniated disk, the disc material pressing on the nerve root is removed (decompression). The area where the disk is removed is then filled with extra bone. Bone from another part of your body (autogenous bone) or bone from a bone donor (allograft bone) may be used. The extra bone promotes fusion between the vertebrae. Sometimes, specific medicines are added to the fusion area to promote bone healing. In most cases, screws and rods or metal plates will be used to attach the vertebrae to stabilize them while they fuse.  °AFTER THE PROCEDURE  °· You will stay in a recovery area until the anesthesia has worn off. Your blood pressure and pulse will be checked frequently. °· You will be   given antibiotics to prevent infection. °· You may continue to receive fluids through an intravenous (IV) tube while you are still in the hospital. °· Pain after surgery is normal. You will be given pain medicine. °· You will be taught how to move correctly and how to stand and walk. While in bed, you will be instructed to turn frequently, using a "log rolling"  technique, in which the entire body is moved without twisting the back. °  °This information is not intended to replace advice given to you by your health care provider. Make sure you discuss any questions you have with your health care provider. °  °Document Released: 05/17/2003 Document Revised: 11/10/2011 Document Reviewed: 01/31/2015 °Elsevier Interactive Patient Education ©2016 [employer] ° °

## 2015-11-28 ENCOUNTER — Encounter (HOSPITAL_COMMUNITY): Payer: Self-pay | Admitting: Neurosurgery

## 2015-11-28 NOTE — OR Nursing (Signed)
Addendum created to reflect correct Out of Recovery time

## 2015-12-21 DIAGNOSIS — G894 Chronic pain syndrome: Secondary | ICD-10-CM | POA: Diagnosis not present

## 2015-12-21 DIAGNOSIS — M62830 Muscle spasm of back: Secondary | ICD-10-CM | POA: Diagnosis not present

## 2015-12-21 DIAGNOSIS — M5116 Intervertebral disc disorders with radiculopathy, lumbar region: Secondary | ICD-10-CM | POA: Diagnosis not present

## 2016-01-04 DIAGNOSIS — Z6825 Body mass index (BMI) 25.0-25.9, adult: Secondary | ICD-10-CM | POA: Diagnosis not present

## 2016-01-04 DIAGNOSIS — M5136 Other intervertebral disc degeneration, lumbar region: Secondary | ICD-10-CM | POA: Diagnosis not present

## 2016-01-04 DIAGNOSIS — Z981 Arthrodesis status: Secondary | ICD-10-CM | POA: Diagnosis not present

## 2016-01-04 DIAGNOSIS — M4726 Other spondylosis with radiculopathy, lumbar region: Secondary | ICD-10-CM | POA: Diagnosis not present

## 2016-01-15 DIAGNOSIS — M6281 Muscle weakness (generalized): Secondary | ICD-10-CM | POA: Diagnosis not present

## 2016-01-15 DIAGNOSIS — M5431 Sciatica, right side: Secondary | ICD-10-CM | POA: Diagnosis not present

## 2016-01-15 DIAGNOSIS — R293 Abnormal posture: Secondary | ICD-10-CM | POA: Diagnosis not present

## 2016-01-15 DIAGNOSIS — M2560 Stiffness of unspecified joint, not elsewhere classified: Secondary | ICD-10-CM | POA: Diagnosis not present

## 2016-01-18 DIAGNOSIS — M5136 Other intervertebral disc degeneration, lumbar region: Secondary | ICD-10-CM | POA: Diagnosis not present

## 2016-01-18 DIAGNOSIS — M62830 Muscle spasm of back: Secondary | ICD-10-CM | POA: Diagnosis not present

## 2016-01-18 DIAGNOSIS — G894 Chronic pain syndrome: Secondary | ICD-10-CM | POA: Diagnosis not present

## 2016-01-18 DIAGNOSIS — M5416 Radiculopathy, lumbar region: Secondary | ICD-10-CM | POA: Diagnosis not present

## 2016-01-21 DIAGNOSIS — M2560 Stiffness of unspecified joint, not elsewhere classified: Secondary | ICD-10-CM | POA: Diagnosis not present

## 2016-01-21 DIAGNOSIS — M5431 Sciatica, right side: Secondary | ICD-10-CM | POA: Diagnosis not present

## 2016-01-21 DIAGNOSIS — M6281 Muscle weakness (generalized): Secondary | ICD-10-CM | POA: Diagnosis not present

## 2016-01-21 DIAGNOSIS — R293 Abnormal posture: Secondary | ICD-10-CM | POA: Diagnosis not present

## 2016-01-29 DIAGNOSIS — M5431 Sciatica, right side: Secondary | ICD-10-CM | POA: Diagnosis not present

## 2016-01-29 DIAGNOSIS — R293 Abnormal posture: Secondary | ICD-10-CM | POA: Diagnosis not present

## 2016-01-29 DIAGNOSIS — M2560 Stiffness of unspecified joint, not elsewhere classified: Secondary | ICD-10-CM | POA: Diagnosis not present

## 2016-01-29 DIAGNOSIS — M6281 Muscle weakness (generalized): Secondary | ICD-10-CM | POA: Diagnosis not present

## 2016-01-31 ENCOUNTER — Ambulatory Visit: Payer: BLUE CROSS/BLUE SHIELD | Admitting: Obstetrics & Gynecology

## 2016-01-31 DIAGNOSIS — L720 Epidermal cyst: Secondary | ICD-10-CM | POA: Diagnosis not present

## 2016-01-31 DIAGNOSIS — M6281 Muscle weakness (generalized): Secondary | ICD-10-CM | POA: Diagnosis not present

## 2016-01-31 DIAGNOSIS — L821 Other seborrheic keratosis: Secondary | ICD-10-CM | POA: Diagnosis not present

## 2016-01-31 DIAGNOSIS — R293 Abnormal posture: Secondary | ICD-10-CM | POA: Diagnosis not present

## 2016-01-31 DIAGNOSIS — L301 Dyshidrosis [pompholyx]: Secondary | ICD-10-CM | POA: Diagnosis not present

## 2016-01-31 DIAGNOSIS — M2560 Stiffness of unspecified joint, not elsewhere classified: Secondary | ICD-10-CM | POA: Diagnosis not present

## 2016-01-31 DIAGNOSIS — M5431 Sciatica, right side: Secondary | ICD-10-CM | POA: Diagnosis not present

## 2016-02-05 DIAGNOSIS — M6281 Muscle weakness (generalized): Secondary | ICD-10-CM | POA: Diagnosis not present

## 2016-02-05 DIAGNOSIS — R293 Abnormal posture: Secondary | ICD-10-CM | POA: Diagnosis not present

## 2016-02-05 DIAGNOSIS — M2560 Stiffness of unspecified joint, not elsewhere classified: Secondary | ICD-10-CM | POA: Diagnosis not present

## 2016-02-05 DIAGNOSIS — M5431 Sciatica, right side: Secondary | ICD-10-CM | POA: Diagnosis not present

## 2016-02-07 DIAGNOSIS — M2560 Stiffness of unspecified joint, not elsewhere classified: Secondary | ICD-10-CM | POA: Diagnosis not present

## 2016-02-07 DIAGNOSIS — R293 Abnormal posture: Secondary | ICD-10-CM | POA: Diagnosis not present

## 2016-02-07 DIAGNOSIS — M6281 Muscle weakness (generalized): Secondary | ICD-10-CM | POA: Diagnosis not present

## 2016-02-07 DIAGNOSIS — M5431 Sciatica, right side: Secondary | ICD-10-CM | POA: Diagnosis not present

## 2016-02-14 DIAGNOSIS — M2560 Stiffness of unspecified joint, not elsewhere classified: Secondary | ICD-10-CM | POA: Diagnosis not present

## 2016-02-14 DIAGNOSIS — M6281 Muscle weakness (generalized): Secondary | ICD-10-CM | POA: Diagnosis not present

## 2016-02-14 DIAGNOSIS — R293 Abnormal posture: Secondary | ICD-10-CM | POA: Diagnosis not present

## 2016-02-14 DIAGNOSIS — M5431 Sciatica, right side: Secondary | ICD-10-CM | POA: Diagnosis not present

## 2016-02-15 DIAGNOSIS — G894 Chronic pain syndrome: Secondary | ICD-10-CM | POA: Diagnosis not present

## 2016-02-15 DIAGNOSIS — M5416 Radiculopathy, lumbar region: Secondary | ICD-10-CM | POA: Diagnosis not present

## 2016-02-15 DIAGNOSIS — M5136 Other intervertebral disc degeneration, lumbar region: Secondary | ICD-10-CM | POA: Diagnosis not present

## 2016-02-15 DIAGNOSIS — M62830 Muscle spasm of back: Secondary | ICD-10-CM | POA: Diagnosis not present

## 2016-02-18 DIAGNOSIS — M5431 Sciatica, right side: Secondary | ICD-10-CM | POA: Diagnosis not present

## 2016-02-18 DIAGNOSIS — M2560 Stiffness of unspecified joint, not elsewhere classified: Secondary | ICD-10-CM | POA: Diagnosis not present

## 2016-02-18 DIAGNOSIS — R293 Abnormal posture: Secondary | ICD-10-CM | POA: Diagnosis not present

## 2016-02-18 DIAGNOSIS — M6281 Muscle weakness (generalized): Secondary | ICD-10-CM | POA: Diagnosis not present

## 2016-02-21 DIAGNOSIS — R293 Abnormal posture: Secondary | ICD-10-CM | POA: Diagnosis not present

## 2016-02-21 DIAGNOSIS — M2560 Stiffness of unspecified joint, not elsewhere classified: Secondary | ICD-10-CM | POA: Diagnosis not present

## 2016-02-21 DIAGNOSIS — M6281 Muscle weakness (generalized): Secondary | ICD-10-CM | POA: Diagnosis not present

## 2016-02-21 DIAGNOSIS — M5431 Sciatica, right side: Secondary | ICD-10-CM | POA: Diagnosis not present

## 2016-02-22 ENCOUNTER — Ambulatory Visit (INDEPENDENT_AMBULATORY_CARE_PROVIDER_SITE_OTHER): Payer: BLUE CROSS/BLUE SHIELD | Admitting: Obstetrics & Gynecology

## 2016-02-22 ENCOUNTER — Encounter: Payer: Self-pay | Admitting: Obstetrics & Gynecology

## 2016-02-22 VITALS — BP 110/70 | HR 66 | Resp 14 | Ht 62.5 in | Wt 143.8 lb

## 2016-02-22 DIAGNOSIS — Z205 Contact with and (suspected) exposure to viral hepatitis: Secondary | ICD-10-CM

## 2016-02-22 DIAGNOSIS — Z01419 Encounter for gynecological examination (general) (routine) without abnormal findings: Secondary | ICD-10-CM

## 2016-02-22 NOTE — Progress Notes (Signed)
55 y.o. G0P0 MarriedCaucasianF here for annual exam.  Doing well.  Had back surgery and a lumbar fusion.  Dr. Sherwood Gambler was her surgeon.  Weaning of her medications.  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    Has ruptured left implant.  Saw breast surgeon, Dr. Harlow Mares.  Is considering having this replaced.  Implant is "under warranty".    Saw Dr. Carlean Purl.  Needs colonoscopy in 10 years from initial one.  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    Denies vaginal bleeding.    Patient's last menstrual period was 03/02/2011.          Sexually active: Yes.    The current method of family planning is post menopausal status.    Exercising: Yes.    walking Smoker:  no  Health Maintenance: Pap:  11/17/2014 negative History of abnormal Pap:  no MMG:  05/24/2015 BIRADS 1 negative, 06/20/2015 ultrasound of left implant-ruptured, BIRADS 2 benign   Colonoscopy:  08/17/2009 hemorrhoids  BMD:   03/10/2014 osteopenia, -1.3 TDaP:  Up to date.  Done with Ehinger. Pneumonia vaccine(s):  never  Zostavax:   Never  Hep C testing: drawn today Screening Labs: PCP, Hb today: PCP, Urine today: PCP   reports that she has quit smoking. She has never used smokeless tobacco. She reports that she does not drink alcohol or use illicit drugs.  Past Medical History  Diagnosis Date  . Migraines     better with controlling hypertension  . Ulcerative proctitis (HCC)     suppository as needed for flare ups  . 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 Internal and external hemorrhoids without complication   . Hypertension     takes Lotrel daily  . Muscle spasm     takes Flexeril daily as needed  . History of bronchitis 2 yrs ago  . Arthritis     spine/hands  . Chronic back pain     spondylolisthesis  . History of shingles     Past Surgical History  Procedure Laterality Date  . Spinal cyst  aspiration      back and right side of face  . Bunionectomy Left   . Anterior cruciate ligament repair Left   . Breast enhancement surgery      replaced a second time 9/11  . Colonoscopy w/ biopsies  08/17/2009    Current Outpatient Prescriptions  Medication Sig Dispense Refill  . amlodipine-benazepril (LOTREL) 2.5-10 MG per capsule Take 1 capsule by mouth daily.    . Calcium-Magnesium-Vitamin D (CALCIUM 500 PO) Take by mouth daily.    . cyclobenzaprine (FLEXERIL) 5 MG tablet Take 5 mg by mouth 3 (three) times daily as needed for muscle spasms.     . DENTA 5000 PLUS 1.1 % CREA dental cream Place 1 application onto teeth at bedtime.     . diclofenac sodium (VOLTAREN) 1 % GEL Apply 2 g topically as needed (for pain).     . DULoxetine (CYMBALTA) 60 MG capsule Take 60 mg by mouth daily.    Marland Kitchen HYDROcodone Bitartrate (ZOHYDRO ER) 40 MG C12A Take 1 tablet by mouth 2 (two) times daily.    . mesalamine (CANASA) 1000 MG suppository Place 1,000 mg rectally daily as needed. For flare ups    . Multiple Vitamin (MULTIVITAMIN) tablet Take 1 tablet by mouth daily.    . mupirocin ointment (BACTROBAN)  2 % Place 1 application into the nose 2 (two) times daily. (Patient not taking: Reported on 09/21/2015) 22 g 1  . oxyCODONE (OXY IR/ROXICODONE) 5 MG immediate release tablet Take 1 tablet (5 mg total) by mouth every 4 (four) hours as needed for breakthrough pain. 75 tablet 0  . oxyCODONE-acetaminophen (PERCOCET) 10-325 MG tablet Take 1 tablet by mouth every 4 (four) hours as needed for pain.    Marland Kitchen triamcinolone (KENALOG) 0.1 % paste     . VITAMIN D, CHOLECALCIFEROL, PO Take 4,000 Units by mouth daily.     No current facility-administered medications for this visit.    Family History  Problem Relation Age of Onset  . Lymphoma Mother 51  . Parkinson's disease Father   . Hypertension Father     ROS:  Pertinent items are noted in HPI.  Otherwise, a comprehensive ROS was negative.  Exam:   Filed Vitals:    02/22/16 0908  BP: 110/70  Pulse: 66  Resp: 14  Height: 5' 2.5" (1.588 m)  Weight: 143 lb 12.8 oz (65.227 kg)    General appearance: alert, cooperative and appears stated age Head: Normocephalic, without obvious abnormality, atraumatic Neck: no adenopathy, supple, symmetrical, trachea midline and thyroid normal to inspection and palpation Lungs: clear to auscultation bilaterally Breasts: normal appearance, no masses or tenderness Heart: regular rate and rhythm Abdomen: soft, non-tender; bowel sounds normal; no masses,  no organomegaly Extremities: extremities normal, atraumatic, no cyanosis or edema Skin: Skin color, texture, turgor normal. No rashes or lesions Lymph nodes: Cervical, supraclavicular, and axillary nodes normal. No abnormal inguinal nodes palpated Neurologic: Grossly normal   Pelvic: External genitalia:  no lesions              Urethra:  normal appearing urethra with no masses, tenderness or lesions              Bartholins and Skenes: normal                 Vagina: normal appearing vagina with normal color and discharge, no lesions              Cervix: no lesions              Pap taken: No. Bimanual Exam:  Uterus:  normal size, contour, position, consistency, mobility, non-tender              Adnexa: normal adnexa and no mass, fullness, tenderness               Rectovaginal: Confirms               Anus:  normal sphincter tone, no lesions  Chaperone was present for exam.  A:  Well Woman with normal exam PMP, no HRT Ulcerative proctitis Recent lumber fusion L4-L5 Left breast implant rupture, has seen breast surgeon.  P: Mammogram yearly pap smear with neg HR HPV 10/13. Neg pap 2016.  No pap today Hep C antibody obtained today Labs with PCP yearly.  Already has appt. return annually or prn

## 2016-02-23 LAB — HEPATITIS C ANTIBODY: HCV Ab: NEGATIVE

## 2016-02-25 DIAGNOSIS — M6281 Muscle weakness (generalized): Secondary | ICD-10-CM | POA: Diagnosis not present

## 2016-02-25 DIAGNOSIS — M2560 Stiffness of unspecified joint, not elsewhere classified: Secondary | ICD-10-CM | POA: Diagnosis not present

## 2016-02-25 DIAGNOSIS — R293 Abnormal posture: Secondary | ICD-10-CM | POA: Diagnosis not present

## 2016-02-25 DIAGNOSIS — M5431 Sciatica, right side: Secondary | ICD-10-CM | POA: Diagnosis not present

## 2016-02-28 DIAGNOSIS — M6281 Muscle weakness (generalized): Secondary | ICD-10-CM | POA: Diagnosis not present

## 2016-02-28 DIAGNOSIS — R293 Abnormal posture: Secondary | ICD-10-CM | POA: Diagnosis not present

## 2016-02-28 DIAGNOSIS — M5431 Sciatica, right side: Secondary | ICD-10-CM | POA: Diagnosis not present

## 2016-02-28 DIAGNOSIS — M2560 Stiffness of unspecified joint, not elsewhere classified: Secondary | ICD-10-CM | POA: Diagnosis not present

## 2016-03-13 DIAGNOSIS — R293 Abnormal posture: Secondary | ICD-10-CM | POA: Diagnosis not present

## 2016-03-13 DIAGNOSIS — M6281 Muscle weakness (generalized): Secondary | ICD-10-CM | POA: Diagnosis not present

## 2016-03-13 DIAGNOSIS — M2560 Stiffness of unspecified joint, not elsewhere classified: Secondary | ICD-10-CM | POA: Diagnosis not present

## 2016-03-13 DIAGNOSIS — M5431 Sciatica, right side: Secondary | ICD-10-CM | POA: Diagnosis not present

## 2016-03-17 DIAGNOSIS — M5431 Sciatica, right side: Secondary | ICD-10-CM | POA: Diagnosis not present

## 2016-03-17 DIAGNOSIS — M2560 Stiffness of unspecified joint, not elsewhere classified: Secondary | ICD-10-CM | POA: Diagnosis not present

## 2016-03-17 DIAGNOSIS — M6281 Muscle weakness (generalized): Secondary | ICD-10-CM | POA: Diagnosis not present

## 2016-03-17 DIAGNOSIS — R293 Abnormal posture: Secondary | ICD-10-CM | POA: Diagnosis not present

## 2016-03-20 DIAGNOSIS — M6281 Muscle weakness (generalized): Secondary | ICD-10-CM | POA: Diagnosis not present

## 2016-03-20 DIAGNOSIS — M2560 Stiffness of unspecified joint, not elsewhere classified: Secondary | ICD-10-CM | POA: Diagnosis not present

## 2016-03-20 DIAGNOSIS — M5431 Sciatica, right side: Secondary | ICD-10-CM | POA: Diagnosis not present

## 2016-03-20 DIAGNOSIS — R293 Abnormal posture: Secondary | ICD-10-CM | POA: Diagnosis not present

## 2016-03-21 DIAGNOSIS — M549 Dorsalgia, unspecified: Secondary | ICD-10-CM | POA: Diagnosis not present

## 2016-03-21 DIAGNOSIS — I10 Essential (primary) hypertension: Secondary | ICD-10-CM | POA: Diagnosis not present

## 2016-03-21 DIAGNOSIS — K5120 Ulcerative (chronic) proctitis without complications: Secondary | ICD-10-CM | POA: Diagnosis not present

## 2016-03-21 DIAGNOSIS — E785 Hyperlipidemia, unspecified: Secondary | ICD-10-CM | POA: Diagnosis not present

## 2016-03-24 DIAGNOSIS — R293 Abnormal posture: Secondary | ICD-10-CM | POA: Diagnosis not present

## 2016-03-24 DIAGNOSIS — M6281 Muscle weakness (generalized): Secondary | ICD-10-CM | POA: Diagnosis not present

## 2016-03-24 DIAGNOSIS — M2560 Stiffness of unspecified joint, not elsewhere classified: Secondary | ICD-10-CM | POA: Diagnosis not present

## 2016-03-24 DIAGNOSIS — M5431 Sciatica, right side: Secondary | ICD-10-CM | POA: Diagnosis not present

## 2016-03-27 DIAGNOSIS — R293 Abnormal posture: Secondary | ICD-10-CM | POA: Diagnosis not present

## 2016-03-27 DIAGNOSIS — M6281 Muscle weakness (generalized): Secondary | ICD-10-CM | POA: Diagnosis not present

## 2016-03-27 DIAGNOSIS — M5431 Sciatica, right side: Secondary | ICD-10-CM | POA: Diagnosis not present

## 2016-03-27 DIAGNOSIS — M2560 Stiffness of unspecified joint, not elsewhere classified: Secondary | ICD-10-CM | POA: Diagnosis not present

## 2016-04-02 DIAGNOSIS — M549 Dorsalgia, unspecified: Secondary | ICD-10-CM | POA: Diagnosis not present

## 2016-04-07 ENCOUNTER — Other Ambulatory Visit: Payer: Self-pay | Admitting: Family Medicine

## 2016-04-07 DIAGNOSIS — G8929 Other chronic pain: Secondary | ICD-10-CM

## 2016-04-07 DIAGNOSIS — M549 Dorsalgia, unspecified: Principal | ICD-10-CM

## 2016-04-10 ENCOUNTER — Ambulatory Visit
Admission: RE | Admit: 2016-04-10 | Discharge: 2016-04-10 | Disposition: A | Discharge status: Home / Self Care | Payer: BLUE CROSS/BLUE SHIELD | Source: Ambulatory Visit | Attending: Family Medicine | Admitting: Family Medicine

## 2016-04-10 DIAGNOSIS — R222 Localized swelling, mass and lump, trunk: Secondary | ICD-10-CM | POA: Diagnosis not present

## 2016-04-10 DIAGNOSIS — M545 Low back pain: Secondary | ICD-10-CM | POA: Diagnosis not present

## 2016-04-10 DIAGNOSIS — M549 Dorsalgia, unspecified: Principal | ICD-10-CM

## 2016-04-10 DIAGNOSIS — G8929 Other chronic pain: Secondary | ICD-10-CM

## 2016-04-11 ENCOUNTER — Other Ambulatory Visit: Payer: BLUE CROSS/BLUE SHIELD

## 2016-04-11 DIAGNOSIS — G894 Chronic pain syndrome: Secondary | ICD-10-CM | POA: Diagnosis not present

## 2016-04-11 DIAGNOSIS — M5136 Other intervertebral disc degeneration, lumbar region: Secondary | ICD-10-CM | POA: Diagnosis not present

## 2016-04-11 DIAGNOSIS — M5416 Radiculopathy, lumbar region: Secondary | ICD-10-CM | POA: Diagnosis not present

## 2016-04-11 DIAGNOSIS — M62830 Muscle spasm of back: Secondary | ICD-10-CM | POA: Diagnosis not present

## 2016-05-09 DIAGNOSIS — M5136 Other intervertebral disc degeneration, lumbar region: Secondary | ICD-10-CM | POA: Diagnosis not present

## 2016-05-09 DIAGNOSIS — M7061 Trochanteric bursitis, right hip: Secondary | ICD-10-CM | POA: Diagnosis not present

## 2016-05-09 DIAGNOSIS — G894 Chronic pain syndrome: Secondary | ICD-10-CM | POA: Diagnosis not present

## 2016-05-09 DIAGNOSIS — M62830 Muscle spasm of back: Secondary | ICD-10-CM | POA: Diagnosis not present

## 2016-05-09 DIAGNOSIS — M5416 Radiculopathy, lumbar region: Secondary | ICD-10-CM | POA: Diagnosis not present

## 2016-05-12 DIAGNOSIS — M5416 Radiculopathy, lumbar region: Secondary | ICD-10-CM | POA: Diagnosis not present

## 2016-05-12 DIAGNOSIS — M25551 Pain in right hip: Secondary | ICD-10-CM | POA: Diagnosis not present

## 2016-05-12 DIAGNOSIS — M5136 Other intervertebral disc degeneration, lumbar region: Secondary | ICD-10-CM | POA: Diagnosis not present

## 2016-05-12 DIAGNOSIS — Z981 Arthrodesis status: Secondary | ICD-10-CM | POA: Diagnosis not present

## 2016-05-12 DIAGNOSIS — M4726 Other spondylosis with radiculopathy, lumbar region: Secondary | ICD-10-CM | POA: Diagnosis not present

## 2016-05-14 DIAGNOSIS — M5416 Radiculopathy, lumbar region: Secondary | ICD-10-CM | POA: Diagnosis not present

## 2016-05-15 DIAGNOSIS — M4806 Spinal stenosis, lumbar region: Secondary | ICD-10-CM | POA: Diagnosis not present

## 2016-05-15 DIAGNOSIS — M4726 Other spondylosis with radiculopathy, lumbar region: Secondary | ICD-10-CM | POA: Diagnosis not present

## 2016-05-23 DIAGNOSIS — M25551 Pain in right hip: Secondary | ICD-10-CM | POA: Diagnosis not present

## 2016-05-23 DIAGNOSIS — M4726 Other spondylosis with radiculopathy, lumbar region: Secondary | ICD-10-CM | POA: Diagnosis not present

## 2016-05-23 DIAGNOSIS — M5126 Other intervertebral disc displacement, lumbar region: Secondary | ICD-10-CM | POA: Diagnosis not present

## 2016-05-23 DIAGNOSIS — M5416 Radiculopathy, lumbar region: Secondary | ICD-10-CM | POA: Diagnosis not present

## 2016-05-23 DIAGNOSIS — M5136 Other intervertebral disc degeneration, lumbar region: Secondary | ICD-10-CM | POA: Diagnosis not present

## 2016-05-29 DIAGNOSIS — M5136 Other intervertebral disc degeneration, lumbar region: Secondary | ICD-10-CM | POA: Diagnosis not present

## 2016-05-29 DIAGNOSIS — M4726 Other spondylosis with radiculopathy, lumbar region: Secondary | ICD-10-CM | POA: Diagnosis not present

## 2016-05-29 DIAGNOSIS — M4806 Spinal stenosis, lumbar region: Secondary | ICD-10-CM | POA: Diagnosis not present

## 2016-06-06 DIAGNOSIS — M5136 Other intervertebral disc degeneration, lumbar region: Secondary | ICD-10-CM | POA: Diagnosis not present

## 2016-06-06 DIAGNOSIS — M62830 Muscle spasm of back: Secondary | ICD-10-CM | POA: Diagnosis not present

## 2016-06-06 DIAGNOSIS — G894 Chronic pain syndrome: Secondary | ICD-10-CM | POA: Diagnosis not present

## 2016-06-06 DIAGNOSIS — M5416 Radiculopathy, lumbar region: Secondary | ICD-10-CM | POA: Diagnosis not present

## 2016-06-26 DIAGNOSIS — M47817 Spondylosis without myelopathy or radiculopathy, lumbosacral region: Secondary | ICD-10-CM | POA: Diagnosis not present

## 2016-07-04 DIAGNOSIS — M62830 Muscle spasm of back: Secondary | ICD-10-CM | POA: Diagnosis not present

## 2016-07-04 DIAGNOSIS — M5136 Other intervertebral disc degeneration, lumbar region: Secondary | ICD-10-CM | POA: Diagnosis not present

## 2016-07-04 DIAGNOSIS — M5416 Radiculopathy, lumbar region: Secondary | ICD-10-CM | POA: Diagnosis not present

## 2016-07-04 DIAGNOSIS — G894 Chronic pain syndrome: Secondary | ICD-10-CM | POA: Diagnosis not present

## 2016-07-16 DIAGNOSIS — Z23 Encounter for immunization: Secondary | ICD-10-CM | POA: Diagnosis not present

## 2016-07-31 DIAGNOSIS — G894 Chronic pain syndrome: Secondary | ICD-10-CM | POA: Diagnosis not present

## 2016-07-31 DIAGNOSIS — M5416 Radiculopathy, lumbar region: Secondary | ICD-10-CM | POA: Diagnosis not present

## 2016-07-31 DIAGNOSIS — M62830 Muscle spasm of back: Secondary | ICD-10-CM | POA: Diagnosis not present

## 2016-07-31 DIAGNOSIS — M5136 Other intervertebral disc degeneration, lumbar region: Secondary | ICD-10-CM | POA: Diagnosis not present

## 2016-08-04 DIAGNOSIS — Z981 Arthrodesis status: Secondary | ICD-10-CM | POA: Diagnosis not present

## 2016-08-04 DIAGNOSIS — M25551 Pain in right hip: Secondary | ICD-10-CM | POA: Diagnosis not present

## 2016-08-04 DIAGNOSIS — M5416 Radiculopathy, lumbar region: Secondary | ICD-10-CM | POA: Diagnosis not present

## 2016-08-04 DIAGNOSIS — M5136 Other intervertebral disc degeneration, lumbar region: Secondary | ICD-10-CM | POA: Diagnosis not present

## 2016-08-07 DIAGNOSIS — S76011A Strain of muscle, fascia and tendon of right hip, initial encounter: Secondary | ICD-10-CM | POA: Diagnosis not present

## 2016-08-07 DIAGNOSIS — M25551 Pain in right hip: Secondary | ICD-10-CM | POA: Diagnosis not present

## 2016-08-13 DIAGNOSIS — M5126 Other intervertebral disc displacement, lumbar region: Secondary | ICD-10-CM | POA: Diagnosis not present

## 2016-08-13 DIAGNOSIS — M5416 Radiculopathy, lumbar region: Secondary | ICD-10-CM | POA: Diagnosis not present

## 2016-08-13 DIAGNOSIS — M5136 Other intervertebral disc degeneration, lumbar region: Secondary | ICD-10-CM | POA: Diagnosis not present

## 2016-08-13 DIAGNOSIS — M4726 Other spondylosis with radiculopathy, lumbar region: Secondary | ICD-10-CM | POA: Diagnosis not present

## 2016-08-29 DIAGNOSIS — M5416 Radiculopathy, lumbar region: Secondary | ICD-10-CM | POA: Diagnosis not present

## 2016-08-29 DIAGNOSIS — G894 Chronic pain syndrome: Secondary | ICD-10-CM | POA: Diagnosis not present

## 2016-08-29 DIAGNOSIS — M7061 Trochanteric bursitis, right hip: Secondary | ICD-10-CM | POA: Diagnosis not present

## 2016-08-29 DIAGNOSIS — M5136 Other intervertebral disc degeneration, lumbar region: Secondary | ICD-10-CM | POA: Diagnosis not present

## 2016-08-29 DIAGNOSIS — M62830 Muscle spasm of back: Secondary | ICD-10-CM | POA: Diagnosis not present

## 2016-09-26 DIAGNOSIS — M5136 Other intervertebral disc degeneration, lumbar region: Secondary | ICD-10-CM | POA: Diagnosis not present

## 2016-09-26 DIAGNOSIS — G894 Chronic pain syndrome: Secondary | ICD-10-CM | POA: Diagnosis not present

## 2016-09-26 DIAGNOSIS — M5416 Radiculopathy, lumbar region: Secondary | ICD-10-CM | POA: Diagnosis not present

## 2016-09-26 DIAGNOSIS — M62830 Muscle spasm of back: Secondary | ICD-10-CM | POA: Diagnosis not present

## 2016-10-21 DIAGNOSIS — K5120 Ulcerative (chronic) proctitis without complications: Secondary | ICD-10-CM | POA: Diagnosis not present

## 2016-10-21 DIAGNOSIS — J309 Allergic rhinitis, unspecified: Secondary | ICD-10-CM | POA: Diagnosis not present

## 2016-10-24 DIAGNOSIS — Z79891 Long term (current) use of opiate analgesic: Secondary | ICD-10-CM | POA: Diagnosis not present

## 2016-10-24 DIAGNOSIS — M62830 Muscle spasm of back: Secondary | ICD-10-CM | POA: Diagnosis not present

## 2016-10-24 DIAGNOSIS — G894 Chronic pain syndrome: Secondary | ICD-10-CM | POA: Diagnosis not present

## 2016-10-24 DIAGNOSIS — M5416 Radiculopathy, lumbar region: Secondary | ICD-10-CM | POA: Diagnosis not present

## 2016-10-24 DIAGNOSIS — M5136 Other intervertebral disc degeneration, lumbar region: Secondary | ICD-10-CM | POA: Diagnosis not present

## 2016-11-03 DIAGNOSIS — M65832 Other synovitis and tenosynovitis, left forearm: Secondary | ICD-10-CM | POA: Diagnosis not present

## 2016-11-03 DIAGNOSIS — M79602 Pain in left arm: Secondary | ICD-10-CM | POA: Diagnosis not present

## 2016-11-03 DIAGNOSIS — M79642 Pain in left hand: Secondary | ICD-10-CM | POA: Diagnosis not present

## 2016-11-06 DIAGNOSIS — S61412A Laceration without foreign body of left hand, initial encounter: Secondary | ICD-10-CM | POA: Diagnosis not present

## 2016-11-10 IMAGING — RF DG LUMBAR SPINE 2-3V
1 series · 2 of 2 positions shown · non-contrast
Comparison: 10/01/2015 and MRI 03/14/2015

CLINICAL DATA: L4-L5 PLIF.  49 sec total fluoro time.

EXAM:
DG C-ARM 61-120 MIN; LUMBAR SPINE - 2-3 VIEW

[Series 1: run · 2 of 2 slices shown]
[im 1/2]
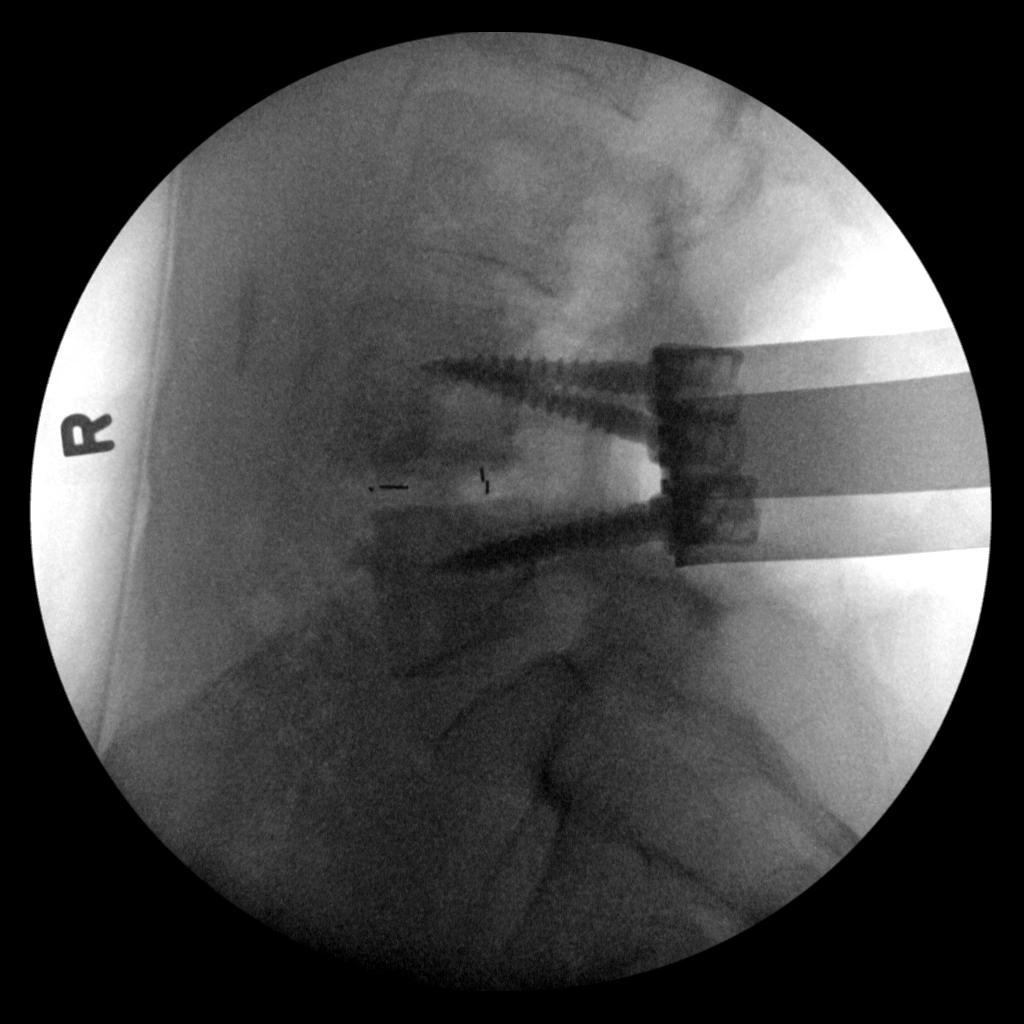
[im 2/2]
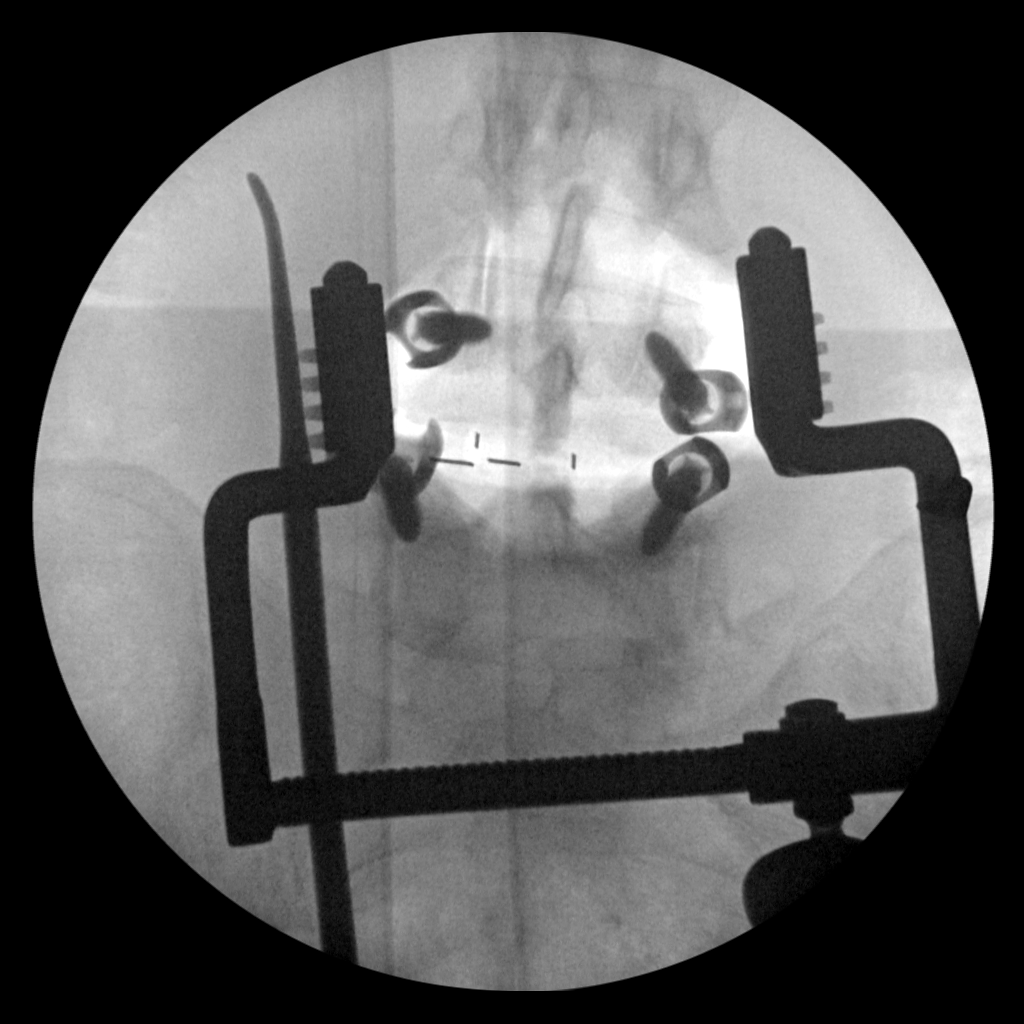

[2 of 2 positions shown; findings below may reference images not displayed]

FINDINGS: Posterior retractor is in place. Transpedicular screws have been
placed at L4-5. Interbody fusion devices identified at the same
level.
IMPRESSION: Posterior fusion L4-5.

## 2016-11-21 DIAGNOSIS — M5416 Radiculopathy, lumbar region: Secondary | ICD-10-CM | POA: Diagnosis not present

## 2016-11-21 DIAGNOSIS — M5136 Other intervertebral disc degeneration, lumbar region: Secondary | ICD-10-CM | POA: Diagnosis not present

## 2016-11-21 DIAGNOSIS — M62830 Muscle spasm of back: Secondary | ICD-10-CM | POA: Diagnosis not present

## 2016-11-21 DIAGNOSIS — G894 Chronic pain syndrome: Secondary | ICD-10-CM | POA: Diagnosis not present

## 2016-12-18 DIAGNOSIS — M5416 Radiculopathy, lumbar region: Secondary | ICD-10-CM | POA: Diagnosis not present

## 2016-12-18 DIAGNOSIS — M62830 Muscle spasm of back: Secondary | ICD-10-CM | POA: Diagnosis not present

## 2016-12-18 DIAGNOSIS — M5136 Other intervertebral disc degeneration, lumbar region: Secondary | ICD-10-CM | POA: Diagnosis not present

## 2016-12-18 DIAGNOSIS — G894 Chronic pain syndrome: Secondary | ICD-10-CM | POA: Diagnosis not present

## 2016-12-30 DIAGNOSIS — M5416 Radiculopathy, lumbar region: Secondary | ICD-10-CM | POA: Diagnosis not present

## 2017-01-16 DIAGNOSIS — M62830 Muscle spasm of back: Secondary | ICD-10-CM | POA: Diagnosis not present

## 2017-01-16 DIAGNOSIS — M5136 Other intervertebral disc degeneration, lumbar region: Secondary | ICD-10-CM | POA: Diagnosis not present

## 2017-01-16 DIAGNOSIS — M5416 Radiculopathy, lumbar region: Secondary | ICD-10-CM | POA: Diagnosis not present

## 2017-01-16 DIAGNOSIS — G894 Chronic pain syndrome: Secondary | ICD-10-CM | POA: Diagnosis not present

## 2017-02-13 DIAGNOSIS — G894 Chronic pain syndrome: Secondary | ICD-10-CM | POA: Diagnosis not present

## 2017-02-13 DIAGNOSIS — M62830 Muscle spasm of back: Secondary | ICD-10-CM | POA: Diagnosis not present

## 2017-02-13 DIAGNOSIS — M5416 Radiculopathy, lumbar region: Secondary | ICD-10-CM | POA: Diagnosis not present

## 2017-02-13 DIAGNOSIS — M8589 Other specified disorders of bone density and structure, multiple sites: Secondary | ICD-10-CM | POA: Diagnosis not present

## 2017-02-13 DIAGNOSIS — Z1231 Encounter for screening mammogram for malignant neoplasm of breast: Secondary | ICD-10-CM | POA: Diagnosis not present

## 2017-02-13 DIAGNOSIS — M5136 Other intervertebral disc degeneration, lumbar region: Secondary | ICD-10-CM | POA: Diagnosis not present

## 2017-02-23 ENCOUNTER — Telehealth: Payer: Self-pay | Admitting: *Deleted

## 2017-02-23 NOTE — Telephone Encounter (Signed)
LMTCB re: BMD results

## 2017-02-24 NOTE — Telephone Encounter (Signed)
Second attempt to contact patient. Left voicemail to call back re: BMD results

## 2017-02-24 NOTE — Telephone Encounter (Signed)
Patient returning call.

## 2017-02-26 NOTE — Telephone Encounter (Signed)
Patient notified. Verbalized understanding   BMD report to scan.  Encounter closed.

## 2017-03-10 DIAGNOSIS — G8929 Other chronic pain: Secondary | ICD-10-CM | POA: Diagnosis not present

## 2017-03-10 DIAGNOSIS — M5416 Radiculopathy, lumbar region: Secondary | ICD-10-CM | POA: Diagnosis not present

## 2017-03-13 DIAGNOSIS — G8929 Other chronic pain: Secondary | ICD-10-CM | POA: Diagnosis not present

## 2017-03-13 DIAGNOSIS — G894 Chronic pain syndrome: Secondary | ICD-10-CM | POA: Diagnosis not present

## 2017-03-13 DIAGNOSIS — M5136 Other intervertebral disc degeneration, lumbar region: Secondary | ICD-10-CM | POA: Diagnosis not present

## 2017-03-13 DIAGNOSIS — M5416 Radiculopathy, lumbar region: Secondary | ICD-10-CM | POA: Diagnosis not present

## 2017-03-13 DIAGNOSIS — M62830 Muscle spasm of back: Secondary | ICD-10-CM | POA: Diagnosis not present

## 2017-03-23 ENCOUNTER — Encounter: Payer: Self-pay | Admitting: Obstetrics & Gynecology

## 2017-03-23 DIAGNOSIS — M5416 Radiculopathy, lumbar region: Secondary | ICD-10-CM | POA: Diagnosis not present

## 2017-03-23 DIAGNOSIS — M549 Dorsalgia, unspecified: Secondary | ICD-10-CM | POA: Diagnosis not present

## 2017-03-23 DIAGNOSIS — R748 Abnormal levels of other serum enzymes: Secondary | ICD-10-CM | POA: Diagnosis not present

## 2017-03-23 DIAGNOSIS — K5120 Ulcerative (chronic) proctitis without complications: Secondary | ICD-10-CM | POA: Diagnosis not present

## 2017-03-23 DIAGNOSIS — G8929 Other chronic pain: Secondary | ICD-10-CM | POA: Diagnosis not present

## 2017-03-23 DIAGNOSIS — L659 Nonscarring hair loss, unspecified: Secondary | ICD-10-CM | POA: Diagnosis not present

## 2017-03-23 DIAGNOSIS — E785 Hyperlipidemia, unspecified: Secondary | ICD-10-CM | POA: Diagnosis not present

## 2017-03-23 DIAGNOSIS — I10 Essential (primary) hypertension: Secondary | ICD-10-CM | POA: Diagnosis not present

## 2017-04-01 DIAGNOSIS — M5416 Radiculopathy, lumbar region: Secondary | ICD-10-CM | POA: Diagnosis not present

## 2017-04-01 DIAGNOSIS — G8929 Other chronic pain: Secondary | ICD-10-CM | POA: Diagnosis not present

## 2017-04-06 DIAGNOSIS — M5416 Radiculopathy, lumbar region: Secondary | ICD-10-CM | POA: Diagnosis not present

## 2017-04-06 DIAGNOSIS — G8929 Other chronic pain: Secondary | ICD-10-CM | POA: Diagnosis not present

## 2017-04-10 DIAGNOSIS — M5136 Other intervertebral disc degeneration, lumbar region: Secondary | ICD-10-CM | POA: Diagnosis not present

## 2017-04-10 DIAGNOSIS — G894 Chronic pain syndrome: Secondary | ICD-10-CM | POA: Diagnosis not present

## 2017-04-10 DIAGNOSIS — M62830 Muscle spasm of back: Secondary | ICD-10-CM | POA: Diagnosis not present

## 2017-04-10 DIAGNOSIS — M5416 Radiculopathy, lumbar region: Secondary | ICD-10-CM | POA: Diagnosis not present

## 2017-04-13 DIAGNOSIS — M5416 Radiculopathy, lumbar region: Secondary | ICD-10-CM | POA: Diagnosis not present

## 2017-04-13 DIAGNOSIS — M5126 Other intervertebral disc displacement, lumbar region: Secondary | ICD-10-CM | POA: Diagnosis not present

## 2017-04-13 DIAGNOSIS — I10 Essential (primary) hypertension: Secondary | ICD-10-CM | POA: Diagnosis not present

## 2017-04-13 DIAGNOSIS — M4726 Other spondylosis with radiculopathy, lumbar region: Secondary | ICD-10-CM | POA: Diagnosis not present

## 2017-04-30 DIAGNOSIS — M5416 Radiculopathy, lumbar region: Secondary | ICD-10-CM | POA: Diagnosis not present

## 2017-05-06 DIAGNOSIS — M48061 Spinal stenosis, lumbar region without neurogenic claudication: Secondary | ICD-10-CM | POA: Diagnosis not present

## 2017-05-06 DIAGNOSIS — Z981 Arthrodesis status: Secondary | ICD-10-CM | POA: Diagnosis not present

## 2017-05-06 DIAGNOSIS — M5416 Radiculopathy, lumbar region: Secondary | ICD-10-CM | POA: Diagnosis not present

## 2017-05-06 DIAGNOSIS — M4726 Other spondylosis with radiculopathy, lumbar region: Secondary | ICD-10-CM | POA: Diagnosis not present

## 2017-05-06 DIAGNOSIS — M5126 Other intervertebral disc displacement, lumbar region: Secondary | ICD-10-CM | POA: Diagnosis not present

## 2017-05-08 DIAGNOSIS — M5416 Radiculopathy, lumbar region: Secondary | ICD-10-CM | POA: Diagnosis not present

## 2017-05-08 DIAGNOSIS — G894 Chronic pain syndrome: Secondary | ICD-10-CM | POA: Diagnosis not present

## 2017-05-08 DIAGNOSIS — M62830 Muscle spasm of back: Secondary | ICD-10-CM | POA: Diagnosis not present

## 2017-05-08 DIAGNOSIS — M5136 Other intervertebral disc degeneration, lumbar region: Secondary | ICD-10-CM | POA: Diagnosis not present

## 2017-05-15 DIAGNOSIS — M5416 Radiculopathy, lumbar region: Secondary | ICD-10-CM | POA: Diagnosis not present

## 2017-05-20 ENCOUNTER — Telehealth: Payer: Self-pay | Admitting: Obstetrics & Gynecology

## 2017-05-20 NOTE — Telephone Encounter (Signed)
Left patient a message to call back to reschedule a future appointment that was cancelled by the provider for her AEX.

## 2017-05-22 ENCOUNTER — Ambulatory Visit: Payer: BLUE CROSS/BLUE SHIELD | Admitting: Obstetrics & Gynecology

## 2017-05-28 ENCOUNTER — Ambulatory Visit: Payer: BLUE CROSS/BLUE SHIELD | Admitting: Obstetrics & Gynecology

## 2017-06-05 DIAGNOSIS — M5416 Radiculopathy, lumbar region: Secondary | ICD-10-CM | POA: Diagnosis not present

## 2017-06-05 DIAGNOSIS — M62830 Muscle spasm of back: Secondary | ICD-10-CM | POA: Diagnosis not present

## 2017-06-05 DIAGNOSIS — M5136 Other intervertebral disc degeneration, lumbar region: Secondary | ICD-10-CM | POA: Diagnosis not present

## 2017-06-05 DIAGNOSIS — G894 Chronic pain syndrome: Secondary | ICD-10-CM | POA: Diagnosis not present

## 2017-06-09 DIAGNOSIS — Z23 Encounter for immunization: Secondary | ICD-10-CM | POA: Diagnosis not present

## 2017-06-11 ENCOUNTER — Encounter: Payer: Self-pay | Admitting: Obstetrics & Gynecology

## 2017-06-11 ENCOUNTER — Ambulatory Visit (INDEPENDENT_AMBULATORY_CARE_PROVIDER_SITE_OTHER): Payer: BLUE CROSS/BLUE SHIELD | Admitting: Obstetrics & Gynecology

## 2017-06-11 ENCOUNTER — Other Ambulatory Visit (HOSPITAL_COMMUNITY)
Admission: RE | Admit: 2017-06-11 | Discharge: 2017-06-11 | Disposition: A | Discharge status: Home / Self Care | Payer: BLUE CROSS/BLUE SHIELD | Source: Ambulatory Visit | Attending: Obstetrics & Gynecology | Admitting: Obstetrics & Gynecology

## 2017-06-11 VITALS — BP 116/60 | HR 66 | Resp 14 | Ht 61.5 in | Wt 132.0 lb

## 2017-06-11 DIAGNOSIS — Z01419 Encounter for gynecological examination (general) (routine) without abnormal findings: Secondary | ICD-10-CM

## 2017-06-11 DIAGNOSIS — M545 Low back pain, unspecified: Secondary | ICD-10-CM

## 2017-06-11 DIAGNOSIS — Z124 Encounter for screening for malignant neoplasm of cervix: Secondary | ICD-10-CM | POA: Insufficient documentation

## 2017-06-11 NOTE — Progress Notes (Signed)
56 y.o. G0P0 MarriedCaucasianF here for annual exam.  Had back surgery 10/01/15.  This helped but she is having siatica issues.  Had follow-up next month and having another scan.  On Cymbalta.  Seeing pain specialist--Dr. Greta Doom.  Tried a "new gabapentin" but she felt awful on this.  Back on regular gabapentin.  Using BID Zohydro.  She does NOT want to be on any more medication.  States she feels like she's "cripled" now.  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  Biggest issues is sharp, shooting pain down her right leg.  I do not have most recent MRIs but 2016 showed possible L4 right nerve root compression.  She is also doing accupunture that has not seemed to help.  She just does not know what to do at this point.  Feels like Dr. Rita Ohara is just making her "wait".  Has done one steroid injection that did "nothing".  She and husband have bene caring for her husband's brother--had head/neck cancer.  He died this summer--he was 39.  Had an older lady that she helps care for--almost like a family member.  She had colon cancer and has a colostomy.  Able to move her to [facility].  Had to clean the home and was able to get it sold.  Denies vaginal bleeding.    Patient's last menstrual period was 03/02/2011.          Sexually active: Yes.    The current method of family planning is post menopausal status.    Exercising: Yes.    walking Smoker:  Former smoker   Health Maintenance: Pap:  11/17/14 negative, 06/29/12 wnl, HR HPV negative History of abnormal Pap:  no MMG:  02/13/17 BIRADS 1 negative  Colonoscopy:  08/17/09- patient states repeat 10 years BMD:   02/13/17 mild osteopenia, repeat 3-4 years  TDaP:  UTD with PCP  Pneumonia vaccine(s):  never Zostavax:   never Hep C testing: 02/22/16 negative  Screening Labs: PCP, Hb today: PCP   reports that she has quit smoking. She has never used smokeless tobacco. She reports that she does not drink alcohol or use drugs.  Past Medical History:   Diagnosis Date  . Arthritis    spine/hands  . Chronic back pain    spondylolisthesis  . History of bronchitis 2 yrs ago  . History of shingles   . Hypertension    takes Lotrel daily  . Internal and external hemorrhoids without complication   . Migraines    better with controlling hypertension  . Muscle spasm    takes Flexeril daily as needed  . 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 ANTERIOR CRUCIATE LIGAMENT REPAIR Left   . BACK SURGERY  10/01/2015   . BREAST ENHANCEMENT SURGERY     replaced a second time 9/11  . BUNIONECTOMY Left   . COLONOSCOPY W/ BIOPSIES  08/17/2009  . Spinal Cyst Aspiration     back and right side of face    Current Outpatient Prescriptions  Medication Sig Dispense Refill  . amlodipine-benazepril (LOTREL) 2.5-10 MG per capsule Take 1 capsule by mouth daily.    Marland Kitchen augmented betamethasone dipropionate (DIPROLENE-AF) 0.05 % cream APPLY TO AFFECTED AREA 2 TO 3 TIMES A DAY WHEN FLARED FOR 2 TO 3 WEEKS  0  . Calcium-Magnesium-Vitamin D (CALCIUM 500 PO) Take by mouth  daily.    . CONCERTA 54 MG CR tablet Take 54 mg by mouth every morning.  0  . cyclobenzaprine (FLEXERIL) 5 MG tablet Take 5 mg by mouth 3 (three) times daily as needed for muscle spasms.     . DENTA 5000 PLUS 1.1 % CREA dental cream Place 1 application onto teeth at bedtime.     . diclofenac sodium (VOLTAREN) 1 % GEL Apply 2 g topically as needed (for pain).     . DULoxetine (CYMBALTA) 60 MG capsule Take 60 mg by mouth daily.    Marland Kitchen EPINEPHrine (EPIPEN JR) 0.15 MG/0.3ML injection Inject 0.15 mg into the muscle.    . gabapentin (NEURONTIN) 300 MG capsule TAKE 3 CAPSULE BY MOUTH FOUR TIMES A DAY  2  . mesalamine (CANASA) 1000 MG suppository Place 1,000 mg rectally daily as needed. For flare ups    . Multiple Vitamin (MULTIVITAMIN) tablet Take 1 tablet by mouth daily.     . mupirocin ointment (BACTROBAN) 2 % Place 1 application into the nose 2 (two) times daily. 22 g 1  . oxyCODONE-acetaminophen (PERCOCET) 10-325 MG tablet Take 1 tablet by mouth every 4 (four) hours as needed for pain.    . traZODone (DESYREL) 50 MG tablet TAKE 1, 2, OR 3 TABLETS AT BEDTIME FOR SLEEP DISTURBANCE  3  . tretinoin (RETIN-A) 0.025 % cream APPLY A PEA-SIZED AMOUNT TO FACE AT BEDTIME *PA DENIED*  11  . triamcinolone (KENALOG) 0.1 % paste     . VITAMIN D, CHOLECALCIFEROL, PO Take 4,000 Units by mouth daily.    Marland Kitchen ZOHYDRO ER 30 MG C12A   0   No current facility-administered medications for this visit.     Family History  Problem Relation Age of Onset  . Lymphoma Mother 69  . Parkinson's disease Father   . Hypertension Father     ROS:  Pertinent items are noted in HPI.  Otherwise, a comprehensive ROS was negative.  Exam:   BP 116/60 (BP Location: Right Arm, Patient Position: Sitting, Cuff Size: Normal)   Pulse 66   Resp 14   Ht 5' 1.5" (1.562 m)   Wt 132 lb (59.9 kg)   LMP 03/02/2011   BMI 24.54 kg/m   Weight change: -12#  Height: 5' 1.5" (156.2 cm)  Ht Readings from Last 3 Encounters:  06/11/17 5' 1.5" (1.562 m)  02/22/16 5' 2.5" (1.588 m)  10/01/15 5\' 2"  (1.575 m)    General appearance: alert, cooperative and appears stated age Head: Normocephalic, without obvious abnormality, atraumatic Neck: no adenopathy, supple, symmetrical, trachea midline and thyroid normal to inspection and palpation Lungs: clear to auscultation bilaterally Breasts: normal appearance, no masses or tenderness Heart: regular rate and rhythm Abdomen: soft, non-tender; bowel sounds normal; no masses,  no organomegaly Extremities: extremities normal, atraumatic, no cyanosis or edema Skin: Skin color, texture, turgor normal. No rashes or lesions Lymph nodes: Cervical, supraclavicular, and axillary nodes normal. No abnormal inguinal nodes palpated Neurologic: Grossly normal   Pelvic:  External genitalia:  no lesions              Urethra:  normal appearing urethra with no masses, tenderness or lesions              Bartholins and Skenes: normal                 Vagina: normal appearing vagina with normal color and discharge, no lesions  Cervix: no lesions              Pap taken: Yes.   Bimanual Exam:  Uterus:  normal size, contour, position, consistency, mobility, non-tender              Adnexa: normal adnexa               Rectovaginal: Confirms               Anus:  normal sphincter tone, no lesions  Chaperone was present for exam.  A:  Well Woman with normal exam PMP, no HRT H/o ulcerative proctitis followed now by Dr. Carlean Purl L4-L5 fusion, now with shooting right leg pain Left breast implant rupture, saw Dr. Harlow Mares.  P:   Mammogram guidelines reviewed pap smear and HR HPV obtained today Lab work done with Dr. Marisue Humble Will help pt with referral to Dr. Jenne Campus at [facility] Return annually or prn

## 2017-06-15 ENCOUNTER — Telehealth: Payer: Self-pay | Admitting: Obstetrics & Gynecology

## 2017-06-15 LAB — CYTOLOGY - PAP
DIAGNOSIS: NEGATIVE
HPV (WINDOPATH): NOT DETECTED

## 2017-06-15 NOTE — Telephone Encounter (Signed)
Spoke with patient in regards to referral to Dr Harl Bowie.  Advised patient what information will be needed, in order to assist with coordinating the referral. Patient understood and states she will obtained the needed medical documentation, on disc,  for the referral. Patient will contact our office once this has been completed.  Routing to Dr Sabra Heck  cc: Karmen Bongo, RN  cc: Magdalene Patricia

## 2017-06-22 ENCOUNTER — Telehealth: Payer: Self-pay | Admitting: Obstetrics & Gynecology

## 2017-06-22 NOTE — Telephone Encounter (Signed)
Patient called and left a message at lunch requesting a call back from [place] as soon as possible. No other details given in message.

## 2017-06-22 NOTE — Telephone Encounter (Signed)
Spoke with patient. She advised she spoke with Dr Nelly Laurence  office regarding appointment.

## 2017-06-25 NOTE — Telephone Encounter (Signed)
Left voicemail message requesting a return call from the patient. Upon return call will advise patient referral information from our office has been faxed to [facility] with [place] [facility]. I have spoke with Dara on 06/23/17 to advise patient has her records. Per my conversation with Tonette Bihari, ok to have patient call their office and ask for her directly, to coordinate records review for scheduling.

## 2017-06-30 ENCOUNTER — Other Ambulatory Visit: Payer: Self-pay | Admitting: Neurosurgery

## 2017-06-30 DIAGNOSIS — Z981 Arthrodesis status: Secondary | ICD-10-CM

## 2017-07-02 NOTE — Telephone Encounter (Signed)
Patient called to inform Dr Sabra Heck, Dr Harl Bowie has reviewed medical documentation and has declined to schedule her.  Patient would like to speak with Dr Sabra Heck to discuss other options.  Routing to Dr Sabra Heck

## 2017-07-03 DIAGNOSIS — M5416 Radiculopathy, lumbar region: Secondary | ICD-10-CM | POA: Diagnosis not present

## 2017-07-03 DIAGNOSIS — M5136 Other intervertebral disc degeneration, lumbar region: Secondary | ICD-10-CM | POA: Diagnosis not present

## 2017-07-03 DIAGNOSIS — M62830 Muscle spasm of back: Secondary | ICD-10-CM | POA: Diagnosis not present

## 2017-07-03 DIAGNOSIS — G894 Chronic pain syndrome: Secondary | ICD-10-CM | POA: Diagnosis not present

## 2017-07-06 NOTE — Telephone Encounter (Signed)
Return call to [facility].

## 2017-07-09 NOTE — Telephone Encounter (Signed)
Okay to close encounter or is further follow up needed?

## 2017-07-13 ENCOUNTER — Ambulatory Visit
Admission: RE | Admit: 2017-07-13 | Discharge: 2017-07-13 | Disposition: A | Discharge status: Home / Self Care | Payer: BLUE CROSS/BLUE SHIELD | Source: Ambulatory Visit | Attending: Neurosurgery | Admitting: Neurosurgery

## 2017-07-13 ENCOUNTER — Other Ambulatory Visit: Payer: Self-pay | Admitting: Obstetrics & Gynecology

## 2017-07-13 DIAGNOSIS — M5126 Other intervertebral disc displacement, lumbar region: Secondary | ICD-10-CM | POA: Diagnosis not present

## 2017-07-13 DIAGNOSIS — Z981 Arthrodesis status: Secondary | ICD-10-CM

## 2017-07-13 DIAGNOSIS — M79606 Pain in leg, unspecified: Secondary | ICD-10-CM

## 2017-07-17 DIAGNOSIS — M5136 Other intervertebral disc degeneration, lumbar region: Secondary | ICD-10-CM | POA: Diagnosis not present

## 2017-07-17 DIAGNOSIS — M4726 Other spondylosis with radiculopathy, lumbar region: Secondary | ICD-10-CM | POA: Diagnosis not present

## 2017-07-17 DIAGNOSIS — M5416 Radiculopathy, lumbar region: Secondary | ICD-10-CM | POA: Diagnosis not present

## 2017-07-17 DIAGNOSIS — M5126 Other intervertebral disc displacement, lumbar region: Secondary | ICD-10-CM | POA: Diagnosis not present

## 2017-07-20 ENCOUNTER — Other Ambulatory Visit: Payer: Self-pay | Admitting: Neurosurgery

## 2017-07-20 NOTE — Telephone Encounter (Signed)
Yes, ok to close this.  Referral to [facility] is being done.  Thanks.

## 2017-07-31 DIAGNOSIS — G894 Chronic pain syndrome: Secondary | ICD-10-CM | POA: Diagnosis not present

## 2017-07-31 DIAGNOSIS — Z79891 Long term (current) use of opiate analgesic: Secondary | ICD-10-CM | POA: Diagnosis not present

## 2017-07-31 DIAGNOSIS — M5136 Other intervertebral disc degeneration, lumbar region: Secondary | ICD-10-CM | POA: Diagnosis not present

## 2017-07-31 DIAGNOSIS — M62830 Muscle spasm of back: Secondary | ICD-10-CM | POA: Diagnosis not present

## 2017-07-31 DIAGNOSIS — M5416 Radiculopathy, lumbar region: Secondary | ICD-10-CM | POA: Diagnosis not present

## 2017-08-06 ENCOUNTER — Telehealth: Payer: Self-pay | Admitting: Obstetrics & Gynecology

## 2017-08-06 NOTE — Telephone Encounter (Signed)
Patient was referred to Dr Ellender Hose, with [facility].  I have spoken with Jackelyn Poling, in their office, she has advised they have left multiple voicemail messages requesting a return call for scheduling. Since patient has not return any of their calls, they have mailed a letter to the patient on 07/31/17 with their information for scheduling, if patient wants to pursue appointment.  Forwarding to Dr Sabra Heck for review

## 2017-08-20 ENCOUNTER — Encounter (HOSPITAL_COMMUNITY): Payer: Self-pay

## 2017-08-20 NOTE — Pre-Procedure Instructions (Signed)
    Shawna Dean  08/20/2017      [id number] - [place], [place] - [address]. [address] [place] [address] Phone: [phone] Fax: [phone]    Your procedure is scheduled on 08/26/17.  Report to [facility] Admitting at 530 A.M.  Call this number if you have problems the morning of surgery:  [phone]   Remember:  Do not eat food or drink liquids after midnight.  Take these medicines the morning of surgery with A SIP OF WATER ---cymbalta,neurontin,concerta   Do not wear jewelry, make-up or nail polish.  Do not wear lotions, powders, or perfumes, or deodorant.  Do not shave 48 hours prior to surgery.  Men may shave face and neck.  Do not bring valuables to the hospital.  [facility] is not responsible for any belongings or valuables.  Contacts, dentures or bridgework may not be worn into surgery.  Leave your suitcase in the car.  After surgery it may be brought to your room.  For patients admitted to the hospital, discharge time will be determined by your treatment team.  Patients discharged the day of surgery will not be allowed to drive home.   Name and phone number of your driver:    Special instructions:  Do not take any aspirin,anti-inflammatories,vitamins,or herbal supplements 5-7 days prior to surgery.  Please read over the following fact sheets that you were given. MRSA Information

## 2017-08-21 ENCOUNTER — Other Ambulatory Visit: Payer: Self-pay

## 2017-08-21 ENCOUNTER — Encounter (HOSPITAL_COMMUNITY): Payer: Self-pay

## 2017-08-21 ENCOUNTER — Encounter (HOSPITAL_COMMUNITY)
Admission: RE | Admit: 2017-08-21 | Discharge: 2017-08-21 | Disposition: A | Discharge status: Home / Self Care | Payer: BLUE CROSS/BLUE SHIELD | Source: Ambulatory Visit | Attending: Neurosurgery | Admitting: Neurosurgery

## 2017-08-21 DIAGNOSIS — Z01812 Encounter for preprocedural laboratory examination: Secondary | ICD-10-CM | POA: Insufficient documentation

## 2017-08-21 DIAGNOSIS — M62830 Muscle spasm of back: Secondary | ICD-10-CM | POA: Diagnosis not present

## 2017-08-21 DIAGNOSIS — M5416 Radiculopathy, lumbar region: Secondary | ICD-10-CM | POA: Diagnosis not present

## 2017-08-21 DIAGNOSIS — M5136 Other intervertebral disc degeneration, lumbar region: Secondary | ICD-10-CM | POA: Diagnosis not present

## 2017-08-21 DIAGNOSIS — G894 Chronic pain syndrome: Secondary | ICD-10-CM | POA: Diagnosis not present

## 2017-08-21 DIAGNOSIS — Z79891 Long term (current) use of opiate analgesic: Secondary | ICD-10-CM | POA: Diagnosis not present

## 2017-08-21 LAB — CBC
HEMATOCRIT: 39.8 % (ref 36.0–46.0)
Hemoglobin: 13.1 g/dL (ref 12.0–15.0)
MCH: 29.1 pg (ref 26.0–34.0)
MCHC: 32.9 g/dL (ref 30.0–36.0)
MCV: 88.4 fL (ref 78.0–100.0)
Platelets: 177 10*3/uL (ref 150–400)
RBC: 4.5 MIL/uL (ref 3.87–5.11)
RDW: 12.6 % (ref 11.5–15.5)
WBC: 6 10*3/uL (ref 4.0–10.5)

## 2017-08-21 LAB — BASIC METABOLIC PANEL
Anion gap: 7 (ref 5–15)
BUN: 7 mg/dL (ref 6–20)
CHLORIDE: 99 mmol/L — AB (ref 101–111)
CO2: 29 mmol/L (ref 22–32)
Calcium: 9.5 mg/dL (ref 8.9–10.3)
Creatinine, Ser: 0.68 mg/dL (ref 0.44–1.00)
GFR calc non Af Amer: >60 mL/min (ref >60)
Glucose, Bld: 91 mg/dL (ref 65–99)
POTASSIUM: 4.7 mmol/L (ref 3.5–5.1)
SODIUM: 135 mmol/L (ref 135–145)

## 2017-08-21 LAB — SURGICAL PCR SCREEN
MRSA, PCR: NEGATIVE
Staphylococcus aureus: NEGATIVE

## 2017-08-21 LAB — TYPE AND SCREEN
ABO/RH(D): O POS
Antibody Screen: NEGATIVE

## 2017-08-21 NOTE — Progress Notes (Signed)
PCP is Dr. Gaynelle Arabian Denies ever seeing a cardiologist. Denies chest pain, fever, or cough. Denies ever having a stress test, echo, or card cath.

## 2017-08-25 NOTE — Anesthesia Preprocedure Evaluation (Addendum)
Anesthesia Evaluation  Patient identified by MRN, date of birth, ID band Patient awake    Reviewed: Allergy & Precautions, NPO status , Patient's Chart, lab work & pertinent test results  History of Anesthesia Complications Negative for: history of anesthetic complications  Airway Mallampati: II  TM Distance: >3 FB Neck ROM: Full    Dental  (+) Teeth Intact   Pulmonary neg pulmonary ROS, former smoker,    breath sounds clear to auscultation       Cardiovascular hypertension,  Rhythm:Regular Rate:Normal     Neuro/Psych negative neurological ROS     GI/Hepatic Neg liver ROS, PUD, colitis   Endo/Other  negative endocrine ROS  Renal/GU negative Renal ROS     Musculoskeletal  (+) Arthritis ,   Abdominal   Peds  Hematology negative hematology ROS (+)   Anesthesia Other Findings   Reproductive/Obstetrics                            Anesthesia Physical Anesthesia Plan  ASA: II  Anesthesia Plan: General   Post-op Pain Management:    Induction: Intravenous  PONV Risk Score and Plan: 4 or greater and Treatment may vary due to age or medical condition, Ondansetron, Dexamethasone and Midazolam  Airway Management Planned: Oral ETT  Additional Equipment:   Intra-op Plan:   Post-operative Plan: Extubation in OR  Informed Consent: I have reviewed the patients History and Physical, chart, labs and discussed the procedure including the risks, benefits and alternatives for the proposed anesthesia with the patient or authorized representative who has indicated his/her understanding and acceptance.   Dental advisory given  Plan Discussed with: CRNA  Anesthesia Plan Comments:         Anesthesia Quick Evaluation

## 2017-08-26 ENCOUNTER — Inpatient Hospital Stay (HOSPITAL_COMMUNITY): Payer: BLUE CROSS/BLUE SHIELD

## 2017-08-26 ENCOUNTER — Inpatient Hospital Stay (HOSPITAL_COMMUNITY)
Admission: RE | Admit: 2017-08-26 | Discharge: 2017-08-27 | DRG: 454 | Disposition: A | Discharge status: Home / Self Care | Payer: BLUE CROSS/BLUE SHIELD | Source: Ambulatory Visit | Attending: Neurosurgery | Admitting: Neurosurgery

## 2017-08-26 ENCOUNTER — Encounter (HOSPITAL_COMMUNITY): Payer: Self-pay | Admitting: General Practice

## 2017-08-26 ENCOUNTER — Other Ambulatory Visit: Payer: Self-pay

## 2017-08-26 ENCOUNTER — Inpatient Hospital Stay (HOSPITAL_COMMUNITY): Payer: BLUE CROSS/BLUE SHIELD | Admitting: Anesthesiology

## 2017-08-26 ENCOUNTER — Encounter (HOSPITAL_COMMUNITY)
Admission: RE | Disposition: A | Discharge status: Home / Self Care | Payer: Self-pay | Source: Ambulatory Visit | Attending: Neurosurgery

## 2017-08-26 DIAGNOSIS — Z91030 Bee allergy status: Secondary | ICD-10-CM | POA: Diagnosis not present

## 2017-08-26 DIAGNOSIS — M62830 Muscle spasm of back: Secondary | ICD-10-CM | POA: Diagnosis not present

## 2017-08-26 DIAGNOSIS — Z87891 Personal history of nicotine dependence: Secondary | ICD-10-CM | POA: Diagnosis not present

## 2017-08-26 DIAGNOSIS — M4726 Other spondylosis with radiculopathy, lumbar region: Secondary | ICD-10-CM | POA: Diagnosis present

## 2017-08-26 DIAGNOSIS — M5116 Intervertebral disc disorders with radiculopathy, lumbar region: Secondary | ICD-10-CM | POA: Diagnosis not present

## 2017-08-26 DIAGNOSIS — I10 Essential (primary) hypertension: Secondary | ICD-10-CM | POA: Diagnosis present

## 2017-08-26 DIAGNOSIS — Z886 Allergy status to analgesic agent status: Secondary | ICD-10-CM | POA: Diagnosis not present

## 2017-08-26 DIAGNOSIS — Z79899 Other long term (current) drug therapy: Secondary | ICD-10-CM | POA: Diagnosis not present

## 2017-08-26 DIAGNOSIS — Z981 Arthrodesis status: Secondary | ICD-10-CM | POA: Diagnosis not present

## 2017-08-26 DIAGNOSIS — M4326 Fusion of spine, lumbar region: Secondary | ICD-10-CM | POA: Diagnosis not present

## 2017-08-26 DIAGNOSIS — M48062 Spinal stenosis, lumbar region with neurogenic claudication: Secondary | ICD-10-CM | POA: Diagnosis not present

## 2017-08-26 DIAGNOSIS — K5120 Ulcerative (chronic) proctitis without complications: Secondary | ICD-10-CM | POA: Diagnosis present

## 2017-08-26 DIAGNOSIS — Z419 Encounter for procedure for purposes other than remedying health state, unspecified: Secondary | ICD-10-CM

## 2017-08-26 DIAGNOSIS — Z888 Allergy status to other drugs, medicaments and biological substances status: Secondary | ICD-10-CM | POA: Diagnosis not present

## 2017-08-26 SURGERY — POSTERIOR LUMBAR FUSION 1 LEVEL
Anesthesia: General | Site: Back

## 2017-08-26 MED ORDER — AMLODIPINE BESYLATE 5 MG PO TABS: 2.5000 mg | ORAL_TABLET | Freq: Every day | ORAL | Status: DC

## 2017-08-26 MED ORDER — SUCCINYLCHOLINE CHLORIDE 200 MG/10ML IV SOSY
PREFILLED_SYRINGE | INTRAVENOUS | Status: AC
  Filled 2017-08-26: qty 10

## 2017-08-26 MED ORDER — THROMBIN (RECOMBINANT) 5000 UNITS EX SOLR
OROMUCOSAL | Status: DC | PRN
  Administered 2017-08-26: 11:00:00 via TOPICAL

## 2017-08-26 MED ORDER — DEXTROSE-NACL 5-0.45 % IV SOLN: INTRAVENOUS | Status: DC

## 2017-08-26 MED ORDER — LIDOCAINE 2% (20 MG/ML) 5 ML SYRINGE
INTRAMUSCULAR | Status: AC
  Filled 2017-08-26: qty 5

## 2017-08-26 MED ORDER — ACETAMINOPHEN 650 MG RE SUPP
650.0000 mg | RECTAL | Status: DC | PRN
Start: 2017-08-26 — End: 2017-08-27

## 2017-08-26 MED ORDER — MORPHINE SULFATE (PF) 4 MG/ML IV SOLN: 4.0000 mg | INTRAVENOUS | Status: DC | PRN

## 2017-08-26 MED ORDER — DULOXETINE HCL 60 MG PO CPEP
60.0000 mg | ORAL_CAPSULE | Freq: Every day | ORAL | Status: DC
  Administered 2017-08-26 – 2017-08-27 (×2): 60 mg via ORAL
  Filled 2017-08-26: qty 2
  Filled 2017-08-26 (×2): qty 1

## 2017-08-26 MED ORDER — OXYCODONE-ACETAMINOPHEN 5-325 MG PO TABS
1.0000 | ORAL_TABLET | Freq: Four times a day (QID) | ORAL | Status: DC
  Administered 2017-08-26 – 2017-08-27 (×5): 1 via ORAL
  Filled 2017-08-26 (×5): qty 1

## 2017-08-26 MED ORDER — LACTATED RINGERS IV SOLN
INTRAVENOUS | Status: DC | PRN
  Administered 2017-08-26 (×2): via INTRAVENOUS

## 2017-08-26 MED ORDER — OXYCODONE HCL 5 MG PO TABS
ORAL_TABLET | ORAL | Status: AC
  Filled 2017-08-26: qty 1

## 2017-08-26 MED ORDER — BISACODYL 10 MG RE SUPP: 10.0000 mg | Freq: Every day | RECTAL | Status: DC | PRN

## 2017-08-26 MED ORDER — SUFENTANIL CITRATE 50 MCG/ML IV SOLN
INTRAVENOUS | Status: DC | PRN
  Administered 2017-08-26 (×7): 5 ug via INTRAVENOUS
  Administered 2017-08-26: 15 ug via INTRAVENOUS

## 2017-08-26 MED ORDER — ONDANSETRON HCL 4 MG/2ML IJ SOLN: 4.0000 mg | Freq: Four times a day (QID) | INTRAMUSCULAR | Status: DC | PRN

## 2017-08-26 MED ORDER — GABAPENTIN 300 MG PO CAPS
300.0000 mg | ORAL_CAPSULE | Freq: Four times a day (QID) | ORAL | Status: DC
  Administered 2017-08-26 – 2017-08-27 (×4): 300 mg via ORAL
  Filled 2017-08-26 (×4): qty 1

## 2017-08-26 MED ORDER — LIDOCAINE-EPINEPHRINE 1 %-1:100000 IJ SOLN
INTRAMUSCULAR | Status: DC | PRN
  Administered 2017-08-26: 15 mL

## 2017-08-26 MED ORDER — BENAZEPRIL HCL 10 MG PO TABS
10.0000 mg | ORAL_TABLET | Freq: Every day | ORAL | Status: DC
  Filled 2017-08-26 (×2): qty 1

## 2017-08-26 MED ORDER — OXYCODONE HCL 5 MG PO TABS
5.0000 mg | ORAL_TABLET | Freq: Four times a day (QID) | ORAL | Status: DC
  Administered 2017-08-26 – 2017-08-27 (×5): 5 mg via ORAL
  Filled 2017-08-26 (×5): qty 1

## 2017-08-26 MED ORDER — MIDAZOLAM HCL 2 MG/2ML IJ SOLN
INTRAMUSCULAR | Status: AC
  Filled 2017-08-26: qty 2

## 2017-08-26 MED ORDER — SODIUM CHLORIDE 0.9 % IR SOLN
Status: DC | PRN
  Administered 2017-08-26: 500 mL

## 2017-08-26 MED ORDER — HYDROXYZINE HCL 50 MG/ML IM SOLN: 50.0000 mg | INTRAMUSCULAR | Status: DC | PRN

## 2017-08-26 MED ORDER — METHYLPHENIDATE HCL ER 18 MG PO TB24
54.0000 mg | ORAL_TABLET | Freq: Every morning | ORAL | Status: DC
  Administered 2017-08-27: 54 mg via ORAL
  Filled 2017-08-26: qty 3

## 2017-08-26 MED ORDER — LIDOCAINE HCL (CARDIAC) 20 MG/ML IV SOLN
INTRAVENOUS | Status: DC | PRN
  Administered 2017-08-26: 60 mg via INTRAVENOUS

## 2017-08-26 MED ORDER — PROPOFOL 500 MG/50ML IV EMUL
INTRAVENOUS | Status: AC
  Filled 2017-08-26: qty 100

## 2017-08-26 MED ORDER — SUGAMMADEX SODIUM 200 MG/2ML IV SOLN
INTRAVENOUS | Status: DC | PRN
  Administered 2017-08-26: 100 mg via INTRAVENOUS

## 2017-08-26 MED ORDER — PROPOFOL 500 MG/50ML IV EMUL
INTRAVENOUS | Status: DC | PRN
  Administered 2017-08-26: 20 ug/kg/min via INTRAVENOUS

## 2017-08-26 MED ORDER — HYDROXYZINE HCL 25 MG PO TABS: 50.0000 mg | ORAL_TABLET | ORAL | Status: DC | PRN

## 2017-08-26 MED ORDER — SODIUM CHLORIDE 0.9% FLUSH: 3.0000 mL | INTRAVENOUS | Status: DC | PRN

## 2017-08-26 MED ORDER — ALBUMIN HUMAN 5 % IV SOLN
INTRAVENOUS | Status: DC | PRN
  Administered 2017-08-26: 11:00:00 via INTRAVENOUS

## 2017-08-26 MED ORDER — HYDROMORPHONE HCL 1 MG/ML IJ SOLN
INTRAMUSCULAR | Status: AC
Start: 2017-08-26 — End: 2017-08-26
  Administered 2017-08-26: 0.5 mg via INTRAVENOUS
  Filled 2017-08-26: qty 1

## 2017-08-26 MED ORDER — PHENOL 1.4 % MT LIQD: 1.0000 | OROMUCOSAL | Status: DC | PRN

## 2017-08-26 MED ORDER — PROPOFOL 10 MG/ML IV BOLUS
INTRAVENOUS | Status: AC
  Filled 2017-08-26: qty 20

## 2017-08-26 MED ORDER — 0.9 % SODIUM CHLORIDE (POUR BTL) OPTIME
TOPICAL | Status: DC | PRN
  Administered 2017-08-26: 1000 mL

## 2017-08-26 MED ORDER — GLYCOPYRROLATE 0.2 MG/ML IJ SOLN
INTRAMUSCULAR | Status: DC | PRN
  Administered 2017-08-26: 0.2 mg via INTRAVENOUS

## 2017-08-26 MED ORDER — OXYCODONE HCL 5 MG PO TABS
5.0000 mg | ORAL_TABLET | Freq: Once | ORAL | Status: AC | PRN
Start: 2017-08-26 — End: 2017-08-26
  Administered 2017-08-26: 5 mg via ORAL

## 2017-08-26 MED ORDER — OXYCODONE HCL 5 MG/5ML PO SOLN: 5.0000 mg | Freq: Once | ORAL | Status: AC | PRN

## 2017-08-26 MED ORDER — SODIUM CHLORIDE 0.9% FLUSH
3.0000 mL | Freq: Two times a day (BID) | INTRAVENOUS | Status: DC
  Administered 2017-08-26: 3 mL via INTRAVENOUS

## 2017-08-26 MED ORDER — SURGIFOAM 100 EX MISC
CUTANEOUS | Status: DC | PRN
  Administered 2017-08-26: 20 mL via TOPICAL

## 2017-08-26 MED ORDER — TRAZODONE HCL 50 MG PO TABS
50.0000 mg | ORAL_TABLET | Freq: Every evening | ORAL | Status: DC | PRN
  Filled 2017-08-26: qty 3

## 2017-08-26 MED ORDER — ACETAMINOPHEN 10 MG/ML IV SOLN
INTRAVENOUS | Status: AC
  Filled 2017-08-26: qty 100

## 2017-08-26 MED ORDER — PROMETHAZINE HCL 25 MG/ML IJ SOLN: 12.5000 mg | Freq: Once | INTRAMUSCULAR | Status: DC | PRN

## 2017-08-26 MED ORDER — SUGAMMADEX SODIUM 200 MG/2ML IV SOLN
INTRAVENOUS | Status: AC
  Filled 2017-08-26: qty 2

## 2017-08-26 MED ORDER — ONDANSETRON HCL 4 MG/2ML IJ SOLN
INTRAMUSCULAR | Status: AC
  Filled 2017-08-26: qty 2

## 2017-08-26 MED ORDER — HYDROCODONE BITARTRATE 30 MG PO C12A: 1.0000 | EXTENDED_RELEASE_CAPSULE | Freq: Two times a day (BID) | ORAL | Status: DC

## 2017-08-26 MED ORDER — DEXAMETHASONE SODIUM PHOSPHATE 10 MG/ML IJ SOLN
INTRAMUSCULAR | Status: AC
  Filled 2017-08-26: qty 1

## 2017-08-26 MED ORDER — ROCURONIUM BROMIDE 100 MG/10ML IV SOLN
INTRAVENOUS | Status: DC | PRN
  Administered 2017-08-26: 40 mg via INTRAVENOUS
  Administered 2017-08-26 (×3): 20 mg via INTRAVENOUS

## 2017-08-26 MED ORDER — MIDAZOLAM HCL 5 MG/5ML IJ SOLN
INTRAMUSCULAR | Status: DC | PRN
  Administered 2017-08-26: 2 mg via INTRAVENOUS

## 2017-08-26 MED ORDER — SCOPOLAMINE 1 MG/3DAYS TD PT72
MEDICATED_PATCH | TRANSDERMAL | Status: DC | PRN
  Administered 2017-08-26: 1 via TRANSDERMAL

## 2017-08-26 MED ORDER — ONDANSETRON HCL 4 MG PO TABS: 4.0000 mg | ORAL_TABLET | Freq: Four times a day (QID) | ORAL | Status: DC | PRN

## 2017-08-26 MED ORDER — LIDOCAINE-EPINEPHRINE 1 %-1:100000 IJ SOLN
INTRAMUSCULAR | Status: AC
  Filled 2017-08-26: qty 1

## 2017-08-26 MED ORDER — ALUM & MAG HYDROXIDE-SIMETH 200-200-20 MG/5ML PO SUSP
30.0000 mL | Freq: Four times a day (QID) | ORAL | Status: DC | PRN
Start: 2017-08-26 — End: 2017-08-27

## 2017-08-26 MED ORDER — PROPOFOL 10 MG/ML IV BOLUS
INTRAVENOUS | Status: DC | PRN
  Administered 2017-08-26: 150 mg via INTRAVENOUS

## 2017-08-26 MED ORDER — CYCLOBENZAPRINE HCL 10 MG PO TABS
ORAL_TABLET | ORAL | Status: AC
  Filled 2017-08-26: qty 1

## 2017-08-26 MED ORDER — SUFENTANIL CITRATE 50 MCG/ML IV SOLN
INTRAVENOUS | Status: AC
  Filled 2017-08-26: qty 1

## 2017-08-26 MED ORDER — BUPIVACAINE HCL (PF) 0.5 % IJ SOLN
INTRAMUSCULAR | Status: AC
  Filled 2017-08-26: qty 30

## 2017-08-26 MED ORDER — SODIUM CHLORIDE 0.9 % IJ SOLN
INTRAMUSCULAR | Status: AC
  Filled 2017-08-26: qty 10

## 2017-08-26 MED ORDER — FLEET ENEMA 7-19 GM/118ML RE ENEM: 1.0000 | ENEMA | Freq: Once | RECTAL | Status: DC | PRN

## 2017-08-26 MED ORDER — HYDROMORPHONE HCL 1 MG/ML IJ SOLN
0.2500 mg | INTRAMUSCULAR | Status: DC | PRN
  Administered 2017-08-26 (×4): 0.5 mg via INTRAVENOUS

## 2017-08-26 MED ORDER — CHLORHEXIDINE GLUCONATE CLOTH 2 % EX PADS: 6.0000 | MEDICATED_PAD | Freq: Once | CUTANEOUS | Status: DC

## 2017-08-26 MED ORDER — SCOPOLAMINE 1 MG/3DAYS TD PT72
MEDICATED_PATCH | TRANSDERMAL | Status: AC
  Filled 2017-08-26: qty 1

## 2017-08-26 MED ORDER — PHENYLEPHRINE HCL 10 MG/ML IJ SOLN
INTRAMUSCULAR | Status: DC | PRN
  Administered 2017-08-26 (×2): 80 ug via INTRAVENOUS

## 2017-08-26 MED ORDER — MENTHOL 3 MG MT LOZG: 1.0000 | LOZENGE | OROMUCOSAL | Status: DC | PRN

## 2017-08-26 MED ORDER — OXYCODONE HCL 5 MG PO TABS
5.0000 mg | ORAL_TABLET | ORAL | Status: DC | PRN
  Administered 2017-08-26 – 2017-08-27 (×2): 10 mg via ORAL
  Filled 2017-08-26 (×2): qty 2

## 2017-08-26 MED ORDER — AMLODIPINE BESY-BENAZEPRIL HCL 2.5-10 MG PO CAPS: 1.0000 | ORAL_CAPSULE | Freq: Every day | ORAL | Status: DC

## 2017-08-26 MED ORDER — FENTANYL CITRATE (PF) 250 MCG/5ML IJ SOLN
INTRAMUSCULAR | Status: AC
  Filled 2017-08-26: qty 5

## 2017-08-26 MED ORDER — OXYCODONE-ACETAMINOPHEN 10-325 MG PO TABS: 1.0000 | ORAL_TABLET | Freq: Four times a day (QID) | ORAL | Status: DC

## 2017-08-26 MED ORDER — ACETAMINOPHEN 325 MG PO TABS: 650.0000 mg | ORAL_TABLET | ORAL | Status: DC | PRN

## 2017-08-26 MED ORDER — CEFAZOLIN SODIUM-DEXTROSE 2-4 GM/100ML-% IV SOLN
2.0000 g | INTRAVENOUS | Status: AC
  Administered 2017-08-26 (×2): 2 g via INTRAVENOUS
  Filled 2017-08-26: qty 100

## 2017-08-26 MED ORDER — PHENYLEPHRINE 40 MCG/ML (10ML) SYRINGE FOR IV PUSH (FOR BLOOD PRESSURE SUPPORT)
PREFILLED_SYRINGE | INTRAVENOUS | Status: AC
  Filled 2017-08-26: qty 10

## 2017-08-26 MED ORDER — BUPIVACAINE HCL (PF) 0.5 % IJ SOLN
INTRAMUSCULAR | Status: DC | PRN
  Administered 2017-08-26: 15 mL

## 2017-08-26 MED ORDER — ONDANSETRON HCL 4 MG/2ML IJ SOLN
INTRAMUSCULAR | Status: DC | PRN
  Administered 2017-08-26: 4 mg via INTRAVENOUS

## 2017-08-26 MED ORDER — THROMBIN (RECOMBINANT) 20000 UNITS EX SOLR
CUTANEOUS | Status: AC
  Filled 2017-08-26: qty 20000

## 2017-08-26 MED ORDER — PHENYLEPHRINE HCL 10 MG/ML IJ SOLN
INTRAVENOUS | Status: DC | PRN
  Administered 2017-08-26: 40 ug/min via INTRAVENOUS

## 2017-08-26 MED ORDER — MAGNESIUM HYDROXIDE 400 MG/5ML PO SUSP: 30.0000 mL | Freq: Every day | ORAL | Status: DC | PRN

## 2017-08-26 MED ORDER — CYCLOBENZAPRINE HCL 5 MG PO TABS
5.0000 mg | ORAL_TABLET | Freq: Three times a day (TID) | ORAL | Status: DC | PRN
  Administered 2017-08-26: 10 mg via ORAL
  Filled 2017-08-26: qty 1

## 2017-08-26 MED ORDER — ROCURONIUM BROMIDE 10 MG/ML (PF) SYRINGE
PREFILLED_SYRINGE | INTRAVENOUS | Status: AC
  Filled 2017-08-26: qty 5

## 2017-08-26 MED ORDER — ACETAMINOPHEN 10 MG/ML IV SOLN
INTRAVENOUS | Status: DC | PRN
  Administered 2017-08-26: 1000 mg via INTRAVENOUS

## 2017-08-26 MED ORDER — THROMBIN (RECOMBINANT) 5000 UNITS EX SOLR
CUTANEOUS | Status: AC
  Filled 2017-08-26: qty 5000

## 2017-08-26 MED ORDER — CEFAZOLIN SODIUM 1 G IJ SOLR
INTRAMUSCULAR | Status: AC
  Filled 2017-08-26: qty 20

## 2017-08-26 MED ORDER — HYDROMORPHONE HCL 1 MG/ML IJ SOLN
INTRAMUSCULAR | Status: AC
  Administered 2017-08-26: 0.5 mg via INTRAVENOUS
  Filled 2017-08-26: qty 1

## 2017-08-26 MED ORDER — DEXAMETHASONE SODIUM PHOSPHATE 10 MG/ML IJ SOLN
INTRAMUSCULAR | Status: DC | PRN
  Administered 2017-08-26: 10 mg via INTRAVENOUS

## 2017-08-26 SURGICAL SUPPLY — 74 items
ADH SKN CLS APL DERMABOND .7 (GAUZE/BANDAGES/DRESSINGS) ×2
APL SKNCLS STERI-STRIP NONHPOA (GAUZE/BANDAGES/DRESSINGS) ×1
AVS WEDGENOSE 10X25X4-8 (Spacer) ×4 IMPLANT
BAG DECANTER FOR FLEXI CONT (MISCELLANEOUS) ×2 IMPLANT
BENZOIN TINCTURE PRP APPL 2/3 (GAUZE/BANDAGES/DRESSINGS) ×2 IMPLANT
BUR ACRON 5.0MM COATED (BURR) ×2 IMPLANT
BUR MATCHSTICK NEURO 3.0 LAGG (BURR) ×2 IMPLANT
CANISTER SUCT 3000ML PPV (MISCELLANEOUS) ×2 IMPLANT
CAP LCK SPNE (Orthopedic Implant) ×6 IMPLANT
CAP LOCK SPINE RADIUS (Orthopedic Implant) IMPLANT
CAP LOCKING (Orthopedic Implant) ×12 IMPLANT
CARTRIDGE OIL MAESTRO DRILL (MISCELLANEOUS) ×1 IMPLANT
CONT SPEC 4OZ CLIKSEAL STRL BL (MISCELLANEOUS) ×2 IMPLANT
COVER BACK TABLE 60X90IN (DRAPES) ×2 IMPLANT
DERMABOND ADVANCED (GAUZE/BANDAGES/DRESSINGS) ×2
DERMABOND ADVANCED .7 DNX12 (GAUZE/BANDAGES/DRESSINGS) ×1 IMPLANT
DIFFUSER DRILL AIR PNEUMATIC (MISCELLANEOUS) ×2 IMPLANT
DRAPE C-ARM 42X72 X-RAY (DRAPES) ×4 IMPLANT
DRAPE C-ARMOR (DRAPES) ×1 IMPLANT
DRAPE HALF SHEET 40X57 (DRAPES) ×1 IMPLANT
DRAPE LAPAROTOMY 100X72X124 (DRAPES) ×2 IMPLANT
DRAPE POUCH INSTRU U-SHP 10X18 (DRAPES) ×2 IMPLANT
ELECT REM PT RETURN 9FT ADLT (ELECTROSURGICAL) ×2
ELECTRODE REM PT RTRN 9FT ADLT (ELECTROSURGICAL) ×1 IMPLANT
GAUZE SPONGE 4X4 12PLY STRL (GAUZE/BANDAGES/DRESSINGS) ×1 IMPLANT
GAUZE SPONGE 4X4 12PLY STRL LF (GAUZE/BANDAGES/DRESSINGS) ×1 IMPLANT
GAUZE SPONGE 4X4 16PLY XRAY LF (GAUZE/BANDAGES/DRESSINGS) IMPLANT
GLOVE BIOGEL PI IND STRL 6.5 (GLOVE) IMPLANT
GLOVE BIOGEL PI IND STRL 8 (GLOVE) ×2 IMPLANT
GLOVE BIOGEL PI IND STRL 8.5 (GLOVE) IMPLANT
GLOVE BIOGEL PI INDICATOR 6.5 (GLOVE) ×1
GLOVE BIOGEL PI INDICATOR 8 (GLOVE) ×3
GLOVE BIOGEL PI INDICATOR 8.5 (GLOVE) ×2
GLOVE ECLIPSE 7.5 STRL STRAW (GLOVE) ×4 IMPLANT
GOWN STRL REUS W/ TWL LRG LVL3 (GOWN DISPOSABLE) IMPLANT
GOWN STRL REUS W/ TWL XL LVL3 (GOWN DISPOSABLE) ×2 IMPLANT
GOWN STRL REUS W/TWL 2XL LVL3 (GOWN DISPOSABLE) ×2 IMPLANT
GOWN STRL REUS W/TWL LRG LVL3 (GOWN DISPOSABLE)
GOWN STRL REUS W/TWL XL LVL3 (GOWN DISPOSABLE) ×4
HEMOSTAT POWDER KIT SURGIFOAM (HEMOSTASIS) ×1 IMPLANT
KIT BASIN OR (CUSTOM PROCEDURE TRAY) ×2 IMPLANT
KIT INFUSE X SMALL 1.4CC (Orthopedic Implant) ×1 IMPLANT
KIT ROOM TURNOVER OR (KITS) ×2 IMPLANT
NDL ASP BONE MRW 8GX15 (NEEDLE) IMPLANT
NDL SPNL 18GX3.5 QUINCKE PK (NEEDLE) ×1 IMPLANT
NDL SPNL 22GX3.5 QUINCKE BK (NEEDLE) ×1 IMPLANT
NEEDLE ASP BONE MRW 8GX15 (NEEDLE) ×2 IMPLANT
NEEDLE SPNL 18GX3.5 QUINCKE PK (NEEDLE) ×2 IMPLANT
NEEDLE SPNL 22GX3.5 QUINCKE BK (NEEDLE) ×4 IMPLANT
NS IRRIG 1000ML POUR BTL (IV SOLUTION) ×2 IMPLANT
OIL CARTRIDGE MAESTRO DRILL (MISCELLANEOUS) ×2
PACK LAMINECTOMY NEURO (CUSTOM PROCEDURE TRAY) ×2 IMPLANT
PAD ARMBOARD 7.5X6 YLW CONV (MISCELLANEOUS) ×7 IMPLANT
PATTIES SURGICAL .5 X.5 (GAUZE/BANDAGES/DRESSINGS) IMPLANT
PATTIES SURGICAL .5 X1 (DISPOSABLE) IMPLANT
PATTIES SURGICAL 1X1 (DISPOSABLE) IMPLANT
ROD 5.5X60MM PURPLE (Rod) ×1 IMPLANT
ROD MAX 50MM (Rod) ×1 IMPLANT
SCREW 5.75X40M (Screw) ×2 IMPLANT
SPACER AVS WEDGENOSE 10X25X4-8 (Spacer) IMPLANT
SPONGE LAP 4X18 X RAY DECT (DISPOSABLE) IMPLANT
SPONGE NEURO XRAY DETECT 1X3 (DISPOSABLE) IMPLANT
SPONGE SURGIFOAM ABS GEL 100 (HEMOSTASIS) ×2 IMPLANT
STRIP BIOACTIVE VITOSS 25X100X (Neuro Prosthesis/Implant) ×1 IMPLANT
STRIP BIOACTIVE VITOSS 25X52X4 (Orthopedic Implant) ×1 IMPLANT
SUT VIC AB 1 CT1 18XBRD ANBCTR (SUTURE) ×2 IMPLANT
SUT VIC AB 1 CT1 8-18 (SUTURE) ×4
SUT VIC AB 2-0 CP2 18 (SUTURE) ×4 IMPLANT
SYR CONTROL 10ML LL (SYRINGE) ×2 IMPLANT
TAPE CLOTH SURG 4X10 WHT LF (GAUZE/BANDAGES/DRESSINGS) ×1 IMPLANT
TOWEL GREEN STERILE (TOWEL DISPOSABLE) ×2 IMPLANT
TOWEL GREEN STERILE FF (TOWEL DISPOSABLE) ×2 IMPLANT
TRAY FOLEY W/METER SILVER 16FR (SET/KITS/TRAYS/PACK) ×2 IMPLANT
WATER STERILE IRR 1000ML POUR (IV SOLUTION) ×2 IMPLANT

## 2017-08-26 NOTE — Transfer of Care (Signed)
Immediate Anesthesia Transfer of Care Note  Patient: Kasaundra Fahrney  Procedure(s) Performed: LUMBAR THREE- LUMBAR FOUR DECOMPRESSION, POSTERIOR LUMBAR INTERBODY FUSION, POSTERIOR LATERAL ARTHRODESIS (Back)  Patient Location: PACU  Anesthesia Type:General  Level of Consciousness: awake and patient cooperative  Airway & Oxygen Therapy: Patient Spontanous Breathing and Patient connected to nasal cannula oxygen  Post-op Assessment: Report given to RN, Post -op Vital signs reviewed and stable, Patient moving all extremities and Patient moving all extremities X 4  Post vital signs: Reviewed and stable  Last Vitals:  Vitals:   08/26/17 0622 08/26/17 1205  BP: 103/71 120/73  Pulse: 73 (!) 111  Resp: 18 13  Temp: 36.4 C 36.9 C  SpO2: 97% 100%    Last Pain:  Vitals:   08/26/17 1205  TempSrc:   PainSc: 0-No pain      Patients Stated Pain Goal: 3 (78/58/85 0277)  Complications: No apparent anesthesia complications

## 2017-08-26 NOTE — Op Note (Signed)
08/26/2017  11:54 AM  PATIENT:  Shawna Dean  56 y.o. 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, lumbar degenerative disease, lumbar spondylosis, right lumbar radiculopathy  PROCEDURE:  Procedure(s):  Bilateral L3-4 lumbar decompression including laminectomy, facetectomy, and foraminotomies for decompression of the stenotic compression of the exiting L3 and L4 nerve roots bilaterally, with decompression beyond that required for interbody arthrodesis; bilateral L3-4 posterior lumbar interbody arthrodesis with AVS peek interbody implants, Vitoss BA with bone marrow aspirate, and infuse; bilateral L3-4 posterior lateral arthrodesis with segmental radius posterior instrumentation (existing pedicle screws at L4 and L5 were tied into the newly placed L3 pedicle screws), Vitoss BA with bone marrow aspirate, and infuse  SURGEON:  Jovita Gamma, M.D.  ASSISTANTS:  Kary Kos, M.D.  ANESTHESIA:   general  EBL:  Total I/O In: 2850 [I.V.:2500; Blood:100; IV Piggyback:250] Out: 1150 [Urine:800; Blood:350]  BLOOD ADMINISTERED:  100 mL of Cell Saver blood  COUNT: Correct per nursing staff  DICTATION: Patient is brought to the operating room placed under general endotracheal anesthesia. The patient was turned to prone position the lumbar region was prepped with Betadine soap and solution and draped in a sterile fashion. The midline was infiltrated with local anesthesia with epinephrine. A localizing x-ray was taken and then a midline incision was made through the previous midline incision and extended rostrally. It was carried down through the subcutaneous tissue, bipolar cautery and electrocautery were used to maintain hemostasis. Dissection was carried  down to the lumbar fascia. The fascia was incised bilaterally and the paraspinal muscles were dissected with a spinous process and lamina in a subperiosteal fashion. Another x-ray was taken for localization and the L3-4 level was localized. Dissection was then carried out laterally over the L3-4 facet complex, we identified the existing posterior instrumentation at the L4 and L5 levels, as well as the transverse processes of L3 and L4, which were decorticated. The locking caps and rods were removed from the L4-5 posterior instrumentation construct, and scar tissue around the screw heads was removed, and their mobility restored.  Bilateral L3 and L4 laminectomy was performed using the high-speed drill and Kerrison punches. Dissection was carried out laterally including facetectomy and foraminotomies with decompression of the stenotic compression of the L3 and L4 nerve roots bilaterally. Once the decompression of the stenotic compression of the thecal sac and exiting nerve roots was completed we proceeded with the posterior lumbar interbody arthrodesis. The annulus was incised bilaterally and the disc space entered. A thorough discectomy was performed using pituitary rongeurs and curettes. Focal disc herniation within the right L3-4 neural foramen was identified and removed as well. Once the discectomy was completed we began to prepare the endplate surfaces removing the cartilaginous endplates surface. We then measured the height of the intervertebral disc space. We selected 10 x 25 x 4 AVS peek interbody implants.  The C-arm fluoroscope was then draped and brought in the field and we identified the pedicle entry points bilaterally at the L3 level. Each of the 2 pedicles was probed, we aspirated bone marrow aspirate from the vertebral body, which was injected over a 10 cc and 5 cc strip of Vitoss BA. Then each of the pedicles was examined with the ball probe good bony surfaces were found and no bony cuts were  found. Each of the pedicles was  then tapped with a 5.25 mm tap, again examined with the ball probe good threading was found and no bony cuts were found. We then placed 5.75 by 40 millimeter screws bilaterally at the L3 level.  We then packed the AVS peek interbody implants with Vitoss BA with bone marrow aspirate and infuse, and then placed the first implant and on the right side, carefully retracting the thecal sac and nerve root medially. We then went back to the left side and packed the midline with additional Vitoss BA with bone marrow aspirate and infuse, and then placed a second implant on the left side again retracting the thecal sac and nerve root medially. Additional Vitoss BA with bone marrow aspirate was packed lateral to the implants.  We then packed the lateral gutter over the transverse processes of L3 and L4, and the intertransverse space with Vitoss BA with bone marrow aspirate and infuse. We then selected hyper lordosed rods.  We used a 16 mm rod on the left and a 50 mm rod on the right.  They were placed within the screw heads and secured with locking caps.  Once all 6 locking caps were placed, final tightening was performed against a counter torque.  The wound had been irrigated multiple times during the procedure with saline solution and bacitracin solution, good hemostasis was established with a combination of bipolar cautery, Gelfoam with thrombin, and Surgifoam. Once good hemostasis was confirmed we proceeded with closure:  deep fascia and Scarpa's fascia were closed with interrupted undyed 1 Vicryl sutures, the subcutaneous and subcuticular closed with interrupted inverted 2-0 undyed Vicryl sutures the skin edges were approximated with Dermabond. A dressing of sterile gauze and Hypafix was applied.  Following surgery the patient was turned back to the supine position to be reversed and the anesthetic extubated and transferred to the recovery room for further care.   PLAN OF CARE:  Admit to inpatient   PATIENT DISPOSITION:  PACU - hemodynamically stable.   Delay start of Pharmacological VTE agent (>24hrs) due to surgical blood loss or risk of bleeding:  yes

## 2017-08-26 NOTE — H&P (Signed)
Subjective: Patient is a 56 y.o. left-handed white female who is admitted for treatment of Right L3-4 lateral recess stenosis contributed by right L4-5 neural foraminal disc herniation with resulting disabling right lumbar radiculopathy. Patient is nearly 2 years status post at L4-5 lumbar decompression, PLIF, and PLA. She did well initially, but has more recently this year developed right lumbar radicular pain, radiating to the right anterior thigh.Patient has not responded to a spinal injections nor acupuncture. She is admitted now for the L3-4 lumbar decompression including laminectomy, facetectomy, and foraminotomy, bilateral L3-4 posterior lumbar interbody arthrodesis with interbody bone graft, bilateral L3-4 posterior lateraarthrodesis with posterior instrumentation and bone graft. We'll plan on using her existing pedicle screws.   Patient Active Problem List   Diagnosis Date Noted  . Lumbar stenosis with neurogenic claudication 10/01/2015  . 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 PERSISTENT DISORDER INITIATING/MAINTAINING SLEEP 07/30/2010  . HYPERTENSION 07/18/2010  . INSOMNIA 07/18/2010   Past Medical History:  Diagnosis Date  . Arthritis    spine/hands  . Chronic back pain    spondylolisthesis  . History of bronchitis 2 yrs ago  . History of shingles   . Hypertension    takes Lotrel daily  . Internal and external hemorrhoids without complication   . Migraines    better with controlling hypertension  . Muscle spasm    takes Flexeril daily as needed  . 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 ANTERIOR CRUCIATE LIGAMENT REPAIR Left   . BACK SURGERY  10/01/2015   . BREAST ENHANCEMENT SURGERY     replaced a second time 9/11  . BUNIONECTOMY Left   . COLONOSCOPY W/ BIOPSIES  08/17/2009  . Spinal Cyst Aspiration     back and right side of face     Medications Prior to Admission  Medication Sig Dispense Refill Last Dose  . amlodipine-benazepril (LOTREL) 2.5-10 MG per capsule Take 1 capsule by mouth daily.   08/25/2017 at Unknown time  . Calcium-Magnesium-Vitamin D (CALCIUM 500 PO) Take 1 tablet by mouth daily.    Past Month at Unknown time  . CONCERTA 54 MG CR tablet Take 54 mg by mouth every morning.  0 08/25/2017 at Unknown time  . cyclobenzaprine (FLEXERIL) 5 MG tablet Take 5 mg by mouth 3 (three) times daily as needed for muscle spasms.    Past Week at Unknown time  . DENTA 5000 PLUS 1.1 % CREA dental cream Place 1 application onto teeth at bedtime.    Past Month at Unknown time  . diclofenac sodium (VOLTAREN) 1 % GEL Apply 2 g topically 3 (three) times daily as needed (for pain).    Past Month at Unknown time  . DULoxetine (CYMBALTA) 60 MG capsule Take 60 mg by mouth daily.   08/25/2017 at Unknown time  . gabapentin (NEURONTIN) 300 MG capsule TAKE 3 CAPSULE BY MOUTH FOUR TIMES A DAY  2 08/26/2017 at 0430  . Multiple Vitamin (MULTIVITAMIN) tablet Take 1 tablet by mouth daily.   Past Month at Unknown time  . oxyCODONE-acetaminophen (PERCOCET) 10-325 MG tablet Take 1 tablet by mouth every 4 (four) hours as needed for pain.   08/26/2017 at 0430  . traZODone (DESYREL) 50 MG tablet TAKE 1, 2, OR 3 TABLETS AT BEDTIME FOR SLEEP DISTURBANCE  3 08/25/2017 at Unknown time  . tretinoin (RETIN-A) 0.025 %  cream APPLY A PEA-SIZED AMOUNT TO FACE AT BEDTIME *PA DENIED*  11 Past Month at Unknown time  . VITAMIN D, CHOLECALCIFEROL, PO Take 4,000 Units by mouth daily.   Past Month at Unknown time  . ZOHYDRO ER 30 MG C12A Take 1 capsule by mouth 2 (two) times daily.   0 08/26/2017 at 0430  . augmented betamethasone dipropionate (DIPROLENE-AF) 0.05 % cream APPLY TO AFFECTED AREA 2 TO 3 TIMES A DAY WHEN FLARED FOR 2 TO 3 WEEKS  0 More than a month at Unknown time  . EPINEPHrine (EPIPEN JR) 0.15 MG/0.3ML injection Inject 0.15 mg into the muscle as needed for  anaphylaxis.    More than a month at Unknown time  . mesalamine (CANASA) 1000 MG suppository Place 1,000 mg rectally daily as needed. For flare ups   More than a month at Unknown time   Allergies  Allergen Reactions  . Aspirin Other (See Comments)    UNSPECIFIED REACTION LIKELY AN [facility] FLARE DUE TO SAME CLASS AS NSAIDS   . Bee Venom Hives  . Nsaids Other (See Comments)    UC flare  . Tolmetin Other (See Comments)    UC flare  . Wasp Venom Hives    YELLOW JACKET    Social History   Tobacco Use  . Smoking status: Former Research scientist (life sciences)  . Smokeless tobacco: Never Used  . Tobacco comment: quit smoking 30 yrs ago  Substance Use Topics  . Alcohol use: No    Alcohol/week: 0.0 oz    Family History  Problem Relation Age of Onset  . Lymphoma Mother 27  . Parkinson's disease Father   . Hypertension Father      Review of Systems A comprehensive review of systems was negative.  Objective: Vital signs in last 24 hours: Temp:  [97.6 F (36.4 C)] 97.6 F (36.4 C) (12/26 0622) Pulse Rate:  [73] 73 (12/26 0622) Resp:  [18] 18 (12/26 0622) BP: (103)/(71) 103/71 (12/26 0622) SpO2:  [97 %] 97 % (12/26 0622) Weight:  [61.1 kg (134 lb 9.6 oz)] 61.1 kg (134 lb 9.6 oz) (12/26 0622)  EXAM: patient well-developed well-nourished white female in no acute distress. Lungs are clear to auscultation , the patient has symmetrical respiratory excursion. Heart has a regular rate and rhythm normal S1 and S2 no murmur.   Abdomen is soft nontender nondistended bowel sounds are present. Extremity examination shows no clubbing cyanosis or edema. Neurologic examination shows on motor exam 5/5 strength in the ias, quadriceps, dorsiflexor, and plantar flexors bilaterally. However the left EHL is 4 and the right EHL is 4+. Sensation is intact to pinprick in the distal lower extremities. Reflexesare symmetrical. Toes are downgoing. Gait and stance favor the right lower extremity.  Data Review:CBC    Component Value  Date/Time   WBC 6.0 08/21/2017 1330   RBC 4.50 08/21/2017 1330   HGB 13.1 08/21/2017 1330   HCT 39.8 08/21/2017 1330   PLT 177 08/21/2017 1330   MCV 88.4 08/21/2017 1330   MCH 29.1 08/21/2017 1330   MCHC 32.9 08/21/2017 1330   RDW 12.6 08/21/2017 1330                          BMET    Component Value Date/Time   NA 135 08/21/2017 1330   K 4.7 08/21/2017 1330   CL 99 (L) 08/21/2017 1330   CO2 29 08/21/2017 1330   GLUCOSE 91 08/21/2017 1330   BUN 7  08/21/2017 1330   CREATININE 0.68 08/21/2017 1330   CALCIUM 9.5 08/21/2017 1330   GFRNONAA >60 08/21/2017 1330   GFRAA >60 08/21/2017 1330     Assessment/Plan: Patient with disabling right lumbar radiculopathy secondary to a 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 She is status post previous L4-5 decompression arthrodesis. She is admitted now for a L3-4 lumbar decompression and arthrodesis.  I've discussed with the patient the nature of his condition, the nature the surgical procedure, the typical length of surgery, hospital stay, and overall recuperation, the limitations postoperatively, and risks of surgery. I discussed risks including risks of infection, bleeding, possibly need for transfusion, the risk of nerve root dysfunction with pain, weakness, numbness, or paresthesias, the risk of dural tear and CSF leakage and possible need for further surgery, the risk of failure of the arthrodesis and possibly for further surgery, the risk of anesthetic complications including myocardial infarction, stroke, pneumonia, and death. We discussed the need for postoperative immobilization in a lumbar brace. Understanding all this the patient does wish to proceed with surgery and is admitted for such.     Hosie Spangle, MD 08/26/2017 7:14 AM

## 2017-08-26 NOTE — Progress Notes (Signed)
Vitals:   08/26/17 1320 08/26/17 1335 08/26/17 1359 08/26/17 1533  BP: (!) 93/58 (!) 97/57 102/78 106/76  Pulse: 72 64 67 70  Resp: 12 12 16 16   Temp:  97.9 F (36.6 C) 98 F (36.7 C) 98.3 F (36.8 C)  TempSrc:      SpO2: 92% 96% 99% 100%  Weight:      Height:        Patient resting in bed, has ambulated in the halls with the staff. Dressing clean and dry. Foley DC'd, and no void yet, nursing staff monitoring voiding function. Patient notes right lumbar radicular pain significantly improved following surgery.  Plan: Doing well following surgery. Encouraged to ambulate in the halls at least twice more this evening.  Hosie Spangle, MD 08/26/2017, 5:17 PM

## 2017-08-26 NOTE — Anesthesia Procedure Notes (Signed)
Procedure Name: Intubation Date/Time: 08/26/2017 7:37 AM Performed by: Izora Gala, CRNA Pre-anesthesia Checklist: Emergency Drugs available, Patient identified, Suction available and Patient being monitored Patient Re-evaluated:Patient Re-evaluated prior to induction Oxygen Delivery Method: Circle system utilized Preoxygenation: Pre-oxygenation with 100% oxygen Induction Type: IV induction Ventilation: Mask ventilation without difficulty Laryngoscope Size: Miller and 3 Grade View: Grade I Tube type: Oral Tube size: 7.0 mm Number of attempts: 1 Placement Confirmation: ETT inserted through vocal cords under direct vision,  positive ETCO2 and breath sounds checked- equal and bilateral Secured at: 21 cm Tube secured with: Tape Dental Injury: Teeth and Oropharynx as per pre-operative assessment

## 2017-08-27 MED ORDER — OXYCODONE HCL 5 MG PO TABS: 5.0000 mg | ORAL_TABLET | ORAL | 0 refills | Status: DC | PRN

## 2017-08-27 MED FILL — Sodium Chloride IV Soln 0.9%: INTRAVENOUS | Qty: 1000 | Status: AC

## 2017-08-27 MED FILL — Heparin Sodium (Porcine) Inj 1000 Unit/ML: INTRAMUSCULAR | Qty: 30 | Status: AC

## 2017-08-27 NOTE — Progress Notes (Signed)
Pt doing well. Pt and husband given D/C instructions with Rx, verbal understanding was provided. Pt's incision is clean and dry with no sign of infection. Pt's IV was removed prior to D/C. Pt D/C'd home via walking @ 1530 per MD order. Pt is stable @ D/C and has no other needs at this time. Holli Humbles, RN

## 2017-08-27 NOTE — Discharge Instructions (Signed)

## 2017-08-27 NOTE — Anesthesia Postprocedure Evaluation (Signed)
Anesthesia Post Note  Patient: Shawna Dean  Procedure(s) Performed: LUMBAR THREE- LUMBAR FOUR DECOMPRESSION, POSTERIOR LUMBAR INTERBODY FUSION, POSTERIOR LATERAL ARTHRODESIS (Back)     Patient location during evaluation: PACU Anesthesia Type: General Level of consciousness: awake and alert Pain management: pain level controlled Vital Signs Assessment: post-procedure vital signs reviewed and stable Respiratory status: spontaneous breathing, nonlabored ventilation, respiratory function stable and patient connected to nasal cannula oxygen Cardiovascular status: blood pressure returned to baseline and stable Postop Assessment: no apparent nausea or vomiting Anesthetic complications: no    Last Vitals:  Vitals:   08/27/17 0400 08/27/17 0813  BP: (!) 104/58 (!) 107/59  Pulse: 66 72  Resp: 18 18  Temp: 36.8 C 36.7 C  SpO2: 100% 98%    Last Pain:  Vitals:   08/27/17 0813  TempSrc: Oral  PainSc:                  Taron Mondor,JAMES TERRILL

## 2017-08-27 NOTE — Discharge Summary (Signed)
Physician Discharge Summary  Patient ID: Shawna Dean MRN: [id number] DOB/AGE: 1961/08/07 56 y.o.  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 Postoperatively she has done well. She's had excellent relief of her radicular pain. She is up and ambulating actively. Her incision is healing nicely; there is no erythema, swelling, or drainage. She is voiding well. She is being discharged home with instructions regarding wound care and activities following discharge. She is scheduled to follow-up with me in mid January.  Discharge Exam: Blood pressure 113/74, pulse 82, temperature 99.3 F (37.4 C), temperature source Oral, resp. rate 18, height 5\' 2"  (1.575 m), weight 61.1 kg (134 lb 9.6 oz), last menstrual period 03/02/2011, SpO2 97 %.  Disposition: 01-Home or Self Care  Discharge Instructions    Discharge wound care:   Complete by:  As directed    Leave the wound open to air. Shower daily with the wound uncovered. Water and soapy water should run over the incision area. Do not wash directly on the incision for 2 weeks. Remove the glue after 2 weeks.   Driving Restrictions   Complete by:  As directed    No driving for 2 weeks. May ride in the car locally now. May begin to drive locally in 2 weeks.   Other Restrictions   Complete by:  As directed    Walk gradually increasing distances out in the fresh air at least twice a day. Walking  additional 6 times inside the house, gradually increasing distances, daily. No bending, lifting, or twisting. Perform activities between shoulder and waist height (that is at counter height when standing or table height when sitting).     Allergies as of 08/27/2017      Reactions   Aspirin Other (See Comments)   UNSPECIFIED REACTION LIKELY AN UC FLARE DUE TO SAME CLASS AS NSAIDS    Bee Venom Hives   Nsaids Other (See Comments)   UC flare   Tolmetin Other (See Comments)   UC flare   Wasp Venom Hives   YELLOW JACKET      Medication List    TAKE these medications   amlodipine-benazepril 2.5-10 MG capsule Commonly known as:  LOTREL Take 1 capsule by mouth daily.   augmented betamethasone dipropionate 0.05 % cream Commonly known as:  DIPROLENE-AF APPLY TO AFFECTED AREA 2 TO 3 TIMES A DAY WHEN FLARED FOR 2 TO 3 WEEKS   CALCIUM 500 PO Take 1 tablet by mouth daily.   CONCERTA 54 MG CR tablet Generic drug:  methylphenidate Take 54 mg by mouth every morning.   cyclobenzaprine 5 MG tablet Commonly known as:  FLEXERIL Take 5 mg by mouth 3 (three) times daily as needed for muscle spasms.   DENTA 5000 PLUS 1.1 % Crea dental cream Generic drug:  sodium fluoride Place 1 application onto teeth at bedtime.   DULoxetine 60 MG capsule Commonly known as:  CYMBALTA Take 60 mg by mouth daily.   EPINEPHrine 0.15 MG/0.3ML injection Commonly known as:  EPIPEN JR Inject 0.15 mg into the muscle as needed for anaphylaxis.  gabapentin 300 MG capsule Commonly known as:  NEURONTIN TAKE 3 CAPSULE BY MOUTH FOUR TIMES A DAY   mesalamine 1000 MG suppository Commonly known as:  CANASA Place 1,000 mg rectally daily as needed. For flare ups   multivitamin tablet Take 1 tablet by mouth daily.   oxyCODONE 5 MG immediate release tablet Commonly known as:  Oxy IR/ROXICODONE Take 1-2 tablets (5-10 mg total) by mouth every 4 (four) hours as needed for breakthrough pain.    oxyCODONE-acetaminophen 10-325 MG tablet Commonly known as:  PERCOCET Take 1 tablet by mouth every 4 (four) hours as needed for pain.   traZODone 50 MG tablet Commonly known as:  DESYREL TAKE 1, 2, OR 3 TABLETS AT BEDTIME FOR SLEEP DISTURBANCE   tretinoin 0.025 % cream Commonly known as:  RETIN-A APPLY A PEA-SIZED AMOUNT TO FACE AT BEDTIME *PA DENIED*   VITAMIN D (CHOLECALCIFEROL) PO Take 4,000 Units by mouth daily.   VOLTAREN 1 % Gel Generic drug:  diclofenac sodium Apply 2 g topically 3 (three) times daily as needed (for pain).   ZOHYDRO ER 30 MG C12a Generic drug:  HYDROcodone Bitartrate Take 1 capsule by mouth 2 (two) times daily.            Discharge Care Instructions  (From admission, onward)        Start     Ordered   08/27/17 0000  Discharge wound care:    Comments:  Leave the wound open to air. Shower daily with the wound uncovered. Water and soapy water should run over the incision area. Do not wash directly on the incision for 2 weeks. Remove the glue after 2 weeks.   08/27/17 1504       Signed: Hosie Spangle 08/27/2017, 3:04 PM

## 2017-09-15 DIAGNOSIS — M48062 Spinal stenosis, lumbar region with neurogenic claudication: Secondary | ICD-10-CM | POA: Diagnosis not present

## 2017-09-21 DIAGNOSIS — M5136 Other intervertebral disc degeneration, lumbar region: Secondary | ICD-10-CM | POA: Diagnosis not present

## 2017-09-21 DIAGNOSIS — M5416 Radiculopathy, lumbar region: Secondary | ICD-10-CM | POA: Diagnosis not present

## 2017-09-21 DIAGNOSIS — G894 Chronic pain syndrome: Secondary | ICD-10-CM | POA: Diagnosis not present

## 2017-09-21 DIAGNOSIS — M62830 Muscle spasm of back: Secondary | ICD-10-CM | POA: Diagnosis not present

## 2017-10-16 DIAGNOSIS — M5136 Other intervertebral disc degeneration, lumbar region: Secondary | ICD-10-CM | POA: Diagnosis not present

## 2017-10-16 DIAGNOSIS — M62830 Muscle spasm of back: Secondary | ICD-10-CM | POA: Diagnosis not present

## 2017-10-16 DIAGNOSIS — M5416 Radiculopathy, lumbar region: Secondary | ICD-10-CM | POA: Diagnosis not present

## 2017-10-16 DIAGNOSIS — G894 Chronic pain syndrome: Secondary | ICD-10-CM | POA: Diagnosis not present

## 2017-11-13 DIAGNOSIS — M62830 Muscle spasm of back: Secondary | ICD-10-CM | POA: Diagnosis not present

## 2017-11-13 DIAGNOSIS — M5416 Radiculopathy, lumbar region: Secondary | ICD-10-CM | POA: Diagnosis not present

## 2017-11-13 DIAGNOSIS — M5136 Other intervertebral disc degeneration, lumbar region: Secondary | ICD-10-CM | POA: Diagnosis not present

## 2017-11-13 DIAGNOSIS — Z981 Arthrodesis status: Secondary | ICD-10-CM | POA: Diagnosis not present

## 2017-11-13 DIAGNOSIS — G894 Chronic pain syndrome: Secondary | ICD-10-CM | POA: Diagnosis not present

## 2017-11-26 DIAGNOSIS — L308 Other specified dermatitis: Secondary | ICD-10-CM | POA: Diagnosis not present

## 2017-12-11 DIAGNOSIS — G894 Chronic pain syndrome: Secondary | ICD-10-CM | POA: Diagnosis not present

## 2017-12-11 DIAGNOSIS — M5136 Other intervertebral disc degeneration, lumbar region: Secondary | ICD-10-CM | POA: Diagnosis not present

## 2017-12-11 DIAGNOSIS — M5416 Radiculopathy, lumbar region: Secondary | ICD-10-CM | POA: Diagnosis not present

## 2017-12-11 DIAGNOSIS — M62830 Muscle spasm of back: Secondary | ICD-10-CM | POA: Diagnosis not present

## 2018-01-08 DIAGNOSIS — G894 Chronic pain syndrome: Secondary | ICD-10-CM | POA: Diagnosis not present

## 2018-01-08 DIAGNOSIS — M5416 Radiculopathy, lumbar region: Secondary | ICD-10-CM | POA: Diagnosis not present

## 2018-01-08 DIAGNOSIS — M62830 Muscle spasm of back: Secondary | ICD-10-CM | POA: Diagnosis not present

## 2018-01-08 DIAGNOSIS — M5136 Other intervertebral disc degeneration, lumbar region: Secondary | ICD-10-CM | POA: Diagnosis not present

## 2018-02-03 DIAGNOSIS — R928 Other abnormal and inconclusive findings on diagnostic imaging of breast: Secondary | ICD-10-CM | POA: Diagnosis not present

## 2018-02-05 DIAGNOSIS — G894 Chronic pain syndrome: Secondary | ICD-10-CM | POA: Diagnosis not present

## 2018-02-05 DIAGNOSIS — M5136 Other intervertebral disc degeneration, lumbar region: Secondary | ICD-10-CM | POA: Diagnosis not present

## 2018-02-05 DIAGNOSIS — M62830 Muscle spasm of back: Secondary | ICD-10-CM | POA: Diagnosis not present

## 2018-02-05 DIAGNOSIS — M5416 Radiculopathy, lumbar region: Secondary | ICD-10-CM | POA: Diagnosis not present

## 2018-03-03 HISTORY — PX: BREAST REDUCTION SURGERY: SHX8

## 2018-03-05 DIAGNOSIS — M5136 Other intervertebral disc degeneration, lumbar region: Secondary | ICD-10-CM | POA: Diagnosis not present

## 2018-03-05 DIAGNOSIS — G894 Chronic pain syndrome: Secondary | ICD-10-CM | POA: Diagnosis not present

## 2018-03-05 DIAGNOSIS — M62830 Muscle spasm of back: Secondary | ICD-10-CM | POA: Diagnosis not present

## 2018-03-05 DIAGNOSIS — M5416 Radiculopathy, lumbar region: Secondary | ICD-10-CM | POA: Diagnosis not present

## 2018-03-24 ENCOUNTER — Encounter: Payer: Self-pay | Admitting: Obstetrics & Gynecology

## 2018-03-25 DIAGNOSIS — E785 Hyperlipidemia, unspecified: Secondary | ICD-10-CM | POA: Diagnosis not present

## 2018-03-25 DIAGNOSIS — K5120 Ulcerative (chronic) proctitis without complications: Secondary | ICD-10-CM | POA: Diagnosis not present

## 2018-03-25 DIAGNOSIS — M549 Dorsalgia, unspecified: Secondary | ICD-10-CM | POA: Diagnosis not present

## 2018-03-25 DIAGNOSIS — I10 Essential (primary) hypertension: Secondary | ICD-10-CM | POA: Diagnosis not present

## 2018-04-02 DIAGNOSIS — G894 Chronic pain syndrome: Secondary | ICD-10-CM | POA: Diagnosis not present

## 2018-04-02 DIAGNOSIS — M5136 Other intervertebral disc degeneration, lumbar region: Secondary | ICD-10-CM | POA: Diagnosis not present

## 2018-04-02 DIAGNOSIS — M5416 Radiculopathy, lumbar region: Secondary | ICD-10-CM | POA: Diagnosis not present

## 2018-04-02 DIAGNOSIS — M62830 Muscle spasm of back: Secondary | ICD-10-CM | POA: Diagnosis not present

## 2018-04-20 DIAGNOSIS — M47816 Spondylosis without myelopathy or radiculopathy, lumbar region: Secondary | ICD-10-CM | POA: Diagnosis not present

## 2018-04-20 DIAGNOSIS — Z981 Arthrodesis status: Secondary | ICD-10-CM | POA: Diagnosis not present

## 2018-04-20 DIAGNOSIS — M5136 Other intervertebral disc degeneration, lumbar region: Secondary | ICD-10-CM | POA: Diagnosis not present

## 2018-04-20 DIAGNOSIS — I10 Essential (primary) hypertension: Secondary | ICD-10-CM | POA: Diagnosis not present

## 2018-04-30 DIAGNOSIS — G894 Chronic pain syndrome: Secondary | ICD-10-CM | POA: Diagnosis not present

## 2018-04-30 DIAGNOSIS — M5136 Other intervertebral disc degeneration, lumbar region: Secondary | ICD-10-CM | POA: Diagnosis not present

## 2018-04-30 DIAGNOSIS — Z79891 Long term (current) use of opiate analgesic: Secondary | ICD-10-CM | POA: Diagnosis not present

## 2018-04-30 DIAGNOSIS — M62830 Muscle spasm of back: Secondary | ICD-10-CM | POA: Diagnosis not present

## 2018-04-30 DIAGNOSIS — M5416 Radiculopathy, lumbar region: Secondary | ICD-10-CM | POA: Diagnosis not present

## 2018-05-19 DIAGNOSIS — Z23 Encounter for immunization: Secondary | ICD-10-CM | POA: Diagnosis not present

## 2018-05-27 DIAGNOSIS — M62830 Muscle spasm of back: Secondary | ICD-10-CM | POA: Diagnosis not present

## 2018-05-27 DIAGNOSIS — M5136 Other intervertebral disc degeneration, lumbar region: Secondary | ICD-10-CM | POA: Diagnosis not present

## 2018-05-27 DIAGNOSIS — M5416 Radiculopathy, lumbar region: Secondary | ICD-10-CM | POA: Diagnosis not present

## 2018-05-27 DIAGNOSIS — G894 Chronic pain syndrome: Secondary | ICD-10-CM | POA: Diagnosis not present

## 2018-06-18 ENCOUNTER — Telehealth: Payer: Self-pay | Admitting: Internal Medicine

## 2018-06-18 NOTE — Telephone Encounter (Signed)
Pt is requesting prescription for canasa sent to [facility] on [address]. She stated that her previous GI used to prescribe it for her proctitis and she is running low.

## 2018-06-18 NOTE — Telephone Encounter (Signed)
Left message to call us back so we can get her on to see one of the PA's next week per Barb Merino, [facility]. She is overdue to see Dr Carlean Purl, last seen 08/2015 and was supposed to follow up in a year.

## 2018-06-21 NOTE — Telephone Encounter (Signed)
Patient has appointment on 06/24/18 with Alonza Bogus, PA-C .

## 2018-06-24 ENCOUNTER — Encounter: Payer: Self-pay | Admitting: Gastroenterology

## 2018-06-24 ENCOUNTER — Ambulatory Visit: Payer: BLUE CROSS/BLUE SHIELD | Admitting: Gastroenterology

## 2018-06-24 VITALS — BP 110/72 | HR 71 | Ht 62.0 in | Wt 137.5 lb

## 2018-06-24 DIAGNOSIS — K51219 Ulcerative (chronic) proctitis with unspecified complications: Secondary | ICD-10-CM

## 2018-06-24 MED ORDER — MESALAMINE 1000 MG RE SUPP: 1000.0000 mg | Freq: Every day | RECTAL | 3 refills | Status: AC | PRN

## 2018-06-24 NOTE — Progress Notes (Signed)
06/24/2018 Shawna Dean [id number] Feb 03, 1961   HISTORY OF PRESENT ILLNESS: This is a 57 year old female who established care with Dr. Carlean Purl in December 2016.  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  She has not been here since establishing her care at that time.  She says that she has done well.  Had a flare of symptoms a couple weeks ago due to something she ate.  Flare consists of lower abdominal discomfort and bloating with some mucus in her stools.  Usually never sees blood, but did see a small amount of blood at that point.  during a flare she uses her suppositories for a week or so and has been completely normal since that time.  Prior to that had not had a flare in almost a year.  Is due for colonoscopy in December 2020.  Had labs performed by her PCP this summer.    Past Medical History:  Diagnosis Date  . Arthritis    spine/hands  . Chronic back pain    spondylolisthesis  . History of bronchitis 2 yrs ago  . History of shingles   . Hypertension    takes Lotrel daily  . Internal and external hemorrhoids without complication   . Migraines    better with controlling hypertension  . Muscle spasm    takes Flexeril daily as needed  . 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 Ulcerative proctitis (HCC)    suppository as needed for flare ups   Past Surgical History:  Procedure Laterality Date  . ANTERIOR CRUCIATE LIGAMENT REPAIR Left   . BACK SURGERY  10/01/2015   . BREAST ENHANCEMENT SURGERY     replaced a second time 9/11  . BREAST REDUCTION SURGERY    . BUNIONECTOMY Left   . COLONOSCOPY W/ BIOPSIES  08/17/2009  . Spinal Cyst Aspiration     back and right side of face    reports that she has quit smoking. She has never used smokeless tobacco. She reports that she does not drink alcohol or use drugs. family history includes Hypertension in her father; Lymphoma (age of onset: 36) in her  mother; Parkinson's disease in her father. Allergies  Allergen Reactions  . Aspirin Other (See Comments)    UNSPECIFIED REACTION LIKELY AN UC FLARE DUE TO SAME CLASS AS NSAIDS   . Bee Venom Hives  . Nsaids Other (See Comments)    UC flare  . Tolmetin Other (See Comments)    UC flare  . Wasp Venom Hives    YELLOW JACKET      Outpatient Encounter Medications as of 06/24/2018  Medication Sig  . amlodipine-benazepril (LOTREL) 2.5-10 MG per capsule Take 1 capsule by mouth daily.  Marland Kitchen augmented betamethasone dipropionate (DIPROLENE-AF) 0.05 % cream APPLY TO AFFECTED AREA 2 TO 3 TIMES A DAY WHEN FLARED FOR 2 TO 3 WEEKS  . Calcium-Magnesium-Vitamin D (CALCIUM 500 PO) Take 1 tablet by mouth daily.   . CONCERTA 54 MG CR tablet Take 54 mg by mouth every morning.  . cyclobenzaprine (FLEXERIL) 5 MG tablet Take 5 mg by mouth 3 (three) times daily as needed for muscle spasms.   . DENTA 5000 PLUS 1.1 % CREA dental cream Place 1 application onto teeth at bedtime.   . diclofenac sodium (VOLTAREN) 1 % GEL Apply 2 g topically 3 (three) times daily as needed (for pain).   . DULoxetine (CYMBALTA) 60 MG capsule Take 60 mg  by mouth daily.  Marland Kitchen EPINEPHrine (EPIPEN JR) 0.15 MG/0.3ML injection Inject 0.15 mg into the muscle as needed for anaphylaxis.   Marland Kitchen gabapentin (NEURONTIN) 300 MG capsule TAKE 3 CAPSULE BY MOUTH FOUR TIMES A DAY  . mesalamine (CANASA) 1000 MG suppository Place 1,000 mg rectally daily as needed. For flare ups  . Multiple Vitamin (MULTIVITAMIN) tablet Take 1 tablet by mouth daily.  Marland Kitchen oxyCODONE (OXY IR/ROXICODONE) 5 MG immediate release tablet Take 1-2 tablets (5-10 mg total) by mouth every 4 (four) hours as needed for breakthrough pain.  Marland Kitchen oxyCODONE-acetaminophen (PERCOCET) 7.5-325 MG tablet Take 1 tablet by mouth every 4 (four) hours as needed for severe pain.  . traZODone (DESYREL) 50 MG tablet TAKE 1, 2, OR 3 TABLETS AT BEDTIME FOR SLEEP DISTURBANCE  . tretinoin (RETIN-A) 0.025 % cream APPLY A  PEA-SIZED AMOUNT TO FACE AT BEDTIME *PA DENIED*  . VITAMIN D, CHOLECALCIFEROL, PO Take 4,000 Units by mouth daily.  Marland Kitchen ZOHYDRO ER 30 MG C12A Take 1 capsule by mouth 2 (two) times daily.   . [DISCONTINUED] oxyCODONE-acetaminophen (PERCOCET) 10-325 MG tablet Take 1 tablet by mouth every 4 (four) hours as needed for pain.   No facility-administered encounter medications on file as of 06/24/2018.      REVIEW OF SYSTEMS  : All other systems reviewed and negative except where noted in the History of Present Illness.   PHYSICAL EXAM: BP 110/72   Pulse 71   Ht 5\' 2"  (1.575 m)   Wt 137 lb 8 oz (62.4 kg)   LMP 03/02/2011   BMI 25.15 kg/m  General: Well developed white female in no acute distress Head: Normocephalic and atraumatic Eyes:  Sclerae anicteric, conjunctiva pink. Ears: Normal auditory acuity Lungs: Clear throughout to auscultation; no increased WOB. Heart: Regular rate and rhythm; no M/R/G. Abdomen: Soft, non-distended.  BS present.  Non-tender. Musculoskeletal: Symmetrical with no gross deformities  Skin: No lesions on visible extremities Extremities: No edema  Neurological: Alert oriented x 4, grossly non-focal Psychological:  Alert and cooperative. Normal mood and affect  ASSESSMENT AND PLAN: 1. Ulcerative proctitis without complication [facility]): Well-controlled with only use of Canasa suppositories intermittently.  Has not been here in almost 3 years.  Needs refills on her Canasa.  Had labs performed  for her PCP this summer so we will obtain those records.  She is due for colonoscopy next winter.    CC:  Gaynelle Arabian, MD

## 2018-06-24 NOTE — Patient Instructions (Signed)
If you are age 59 or older, your body mass index should be between 23-30. Your Body mass index is 25.15 kg/m. If this is out of the aforementioned range listed, please consider follow up with your Primary Care Provider.  If you are age 65 or younger, your body mass index should be between 19-25. Your Body mass index is 25.15 kg/m. If this is out of the aformentioned range listed, please consider follow up with your Primary Care Provider.   We have sent the following medications to your pharmacy for you to pick up at your convenience: [place]  Will obtain lab results from primary care physician.  Thank you for choosing me and [facility].   Alonza Bogus, PA-C

## 2018-06-25 DIAGNOSIS — M62830 Muscle spasm of back: Secondary | ICD-10-CM | POA: Diagnosis not present

## 2018-06-25 DIAGNOSIS — G894 Chronic pain syndrome: Secondary | ICD-10-CM | POA: Diagnosis not present

## 2018-06-25 DIAGNOSIS — M5416 Radiculopathy, lumbar region: Secondary | ICD-10-CM | POA: Diagnosis not present

## 2018-06-25 DIAGNOSIS — M5136 Other intervertebral disc degeneration, lumbar region: Secondary | ICD-10-CM | POA: Diagnosis not present

## 2018-07-23 DIAGNOSIS — G894 Chronic pain syndrome: Secondary | ICD-10-CM | POA: Diagnosis not present

## 2018-07-23 DIAGNOSIS — M5136 Other intervertebral disc degeneration, lumbar region: Secondary | ICD-10-CM | POA: Diagnosis not present

## 2018-07-23 DIAGNOSIS — M62830 Muscle spasm of back: Secondary | ICD-10-CM | POA: Diagnosis not present

## 2018-07-23 DIAGNOSIS — M5416 Radiculopathy, lumbar region: Secondary | ICD-10-CM | POA: Diagnosis not present

## 2018-08-03 DIAGNOSIS — J029 Acute pharyngitis, unspecified: Secondary | ICD-10-CM | POA: Diagnosis not present

## 2018-08-20 ENCOUNTER — Ambulatory Visit: Payer: BLUE CROSS/BLUE SHIELD | Admitting: Obstetrics & Gynecology

## 2018-08-20 DIAGNOSIS — G894 Chronic pain syndrome: Secondary | ICD-10-CM | POA: Diagnosis not present

## 2018-08-20 DIAGNOSIS — M5136 Other intervertebral disc degeneration, lumbar region: Secondary | ICD-10-CM | POA: Diagnosis not present

## 2018-08-20 DIAGNOSIS — M62830 Muscle spasm of back: Secondary | ICD-10-CM | POA: Diagnosis not present

## 2018-08-20 DIAGNOSIS — M5416 Radiculopathy, lumbar region: Secondary | ICD-10-CM | POA: Diagnosis not present

## 2018-08-26 DIAGNOSIS — J019 Acute sinusitis, unspecified: Secondary | ICD-10-CM | POA: Diagnosis not present

## 2018-09-17 DIAGNOSIS — M5136 Other intervertebral disc degeneration, lumbar region: Secondary | ICD-10-CM | POA: Diagnosis not present

## 2018-09-17 DIAGNOSIS — G894 Chronic pain syndrome: Secondary | ICD-10-CM | POA: Diagnosis not present

## 2018-09-17 DIAGNOSIS — M62830 Muscle spasm of back: Secondary | ICD-10-CM | POA: Diagnosis not present

## 2018-09-17 DIAGNOSIS — M5416 Radiculopathy, lumbar region: Secondary | ICD-10-CM | POA: Diagnosis not present

## 2018-10-06 IMAGING — CR DG LUMBAR SPINE 2-3V
3 series · 3 of 3 positions shown · non-contrast
Comparison: None.

CLINICAL DATA: Lumbar spine surgery.

EXAM:
LUMBAR SPINE - 2-3 VIEW

[xtable lateral (1 of 3)]
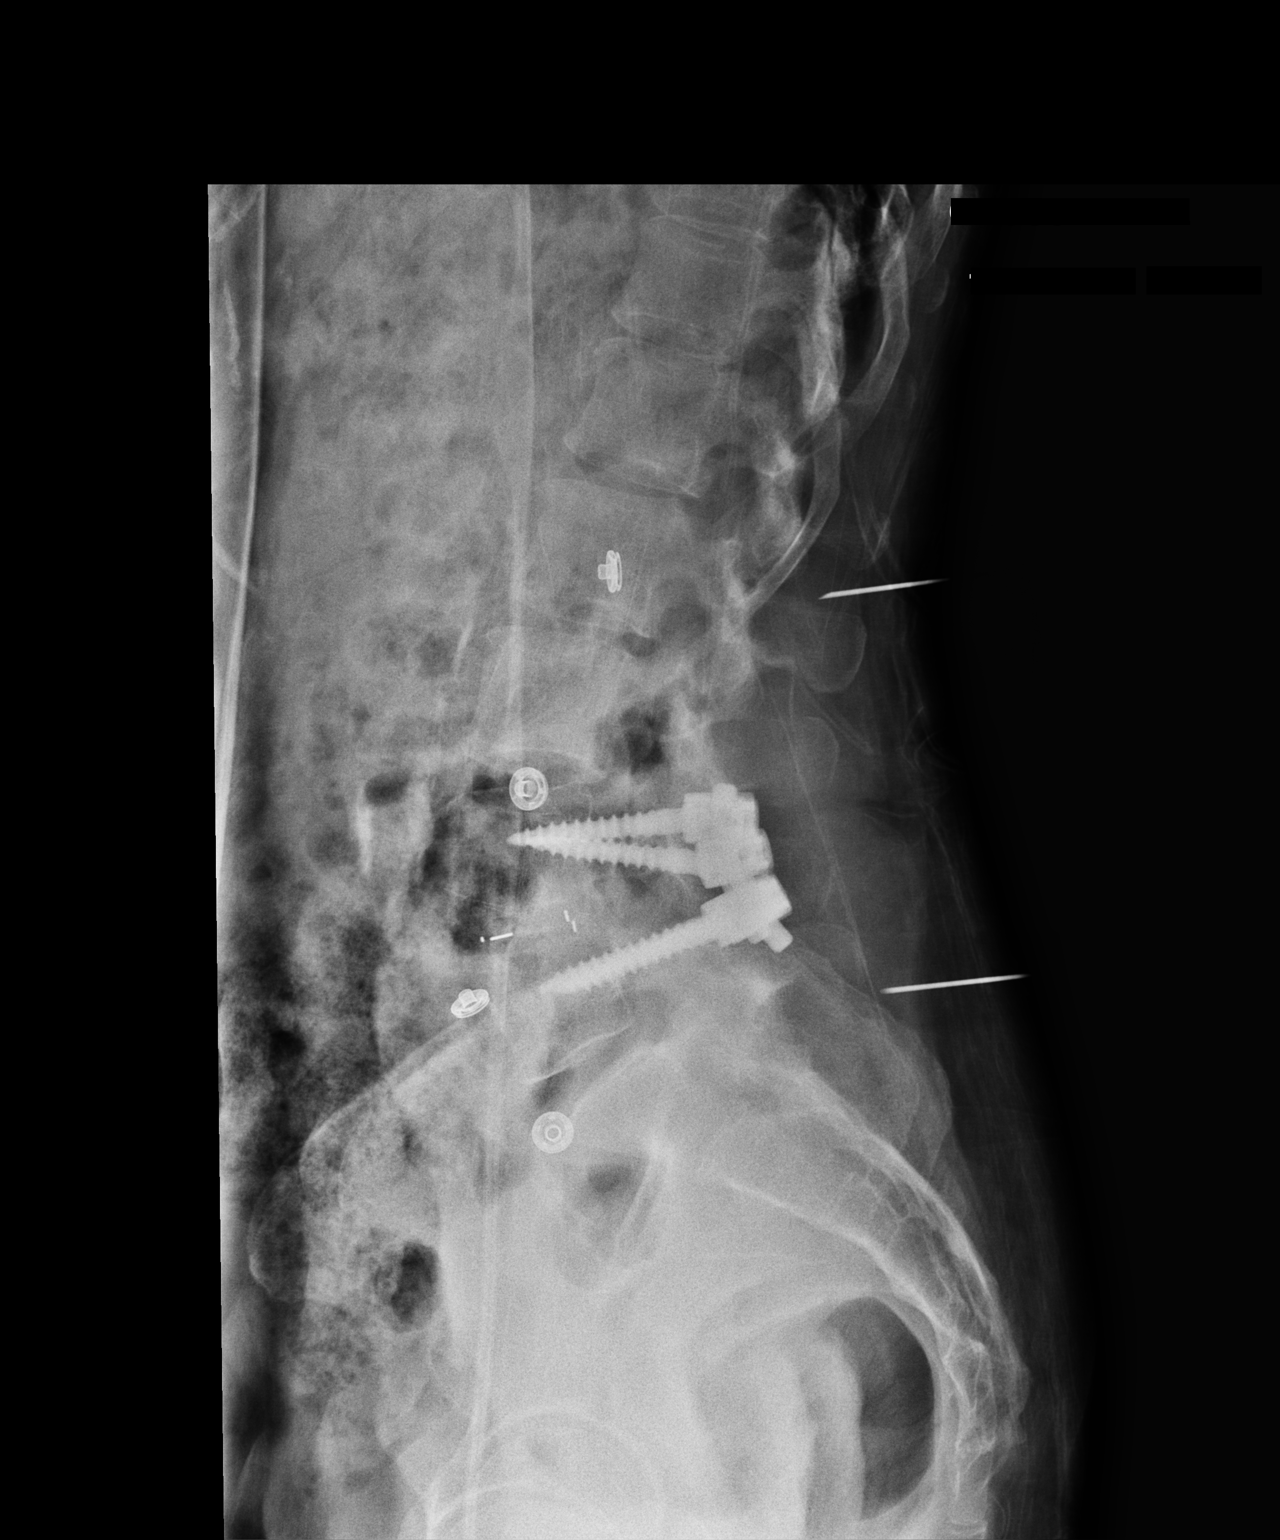

[xtable lateral (2 of 3)]
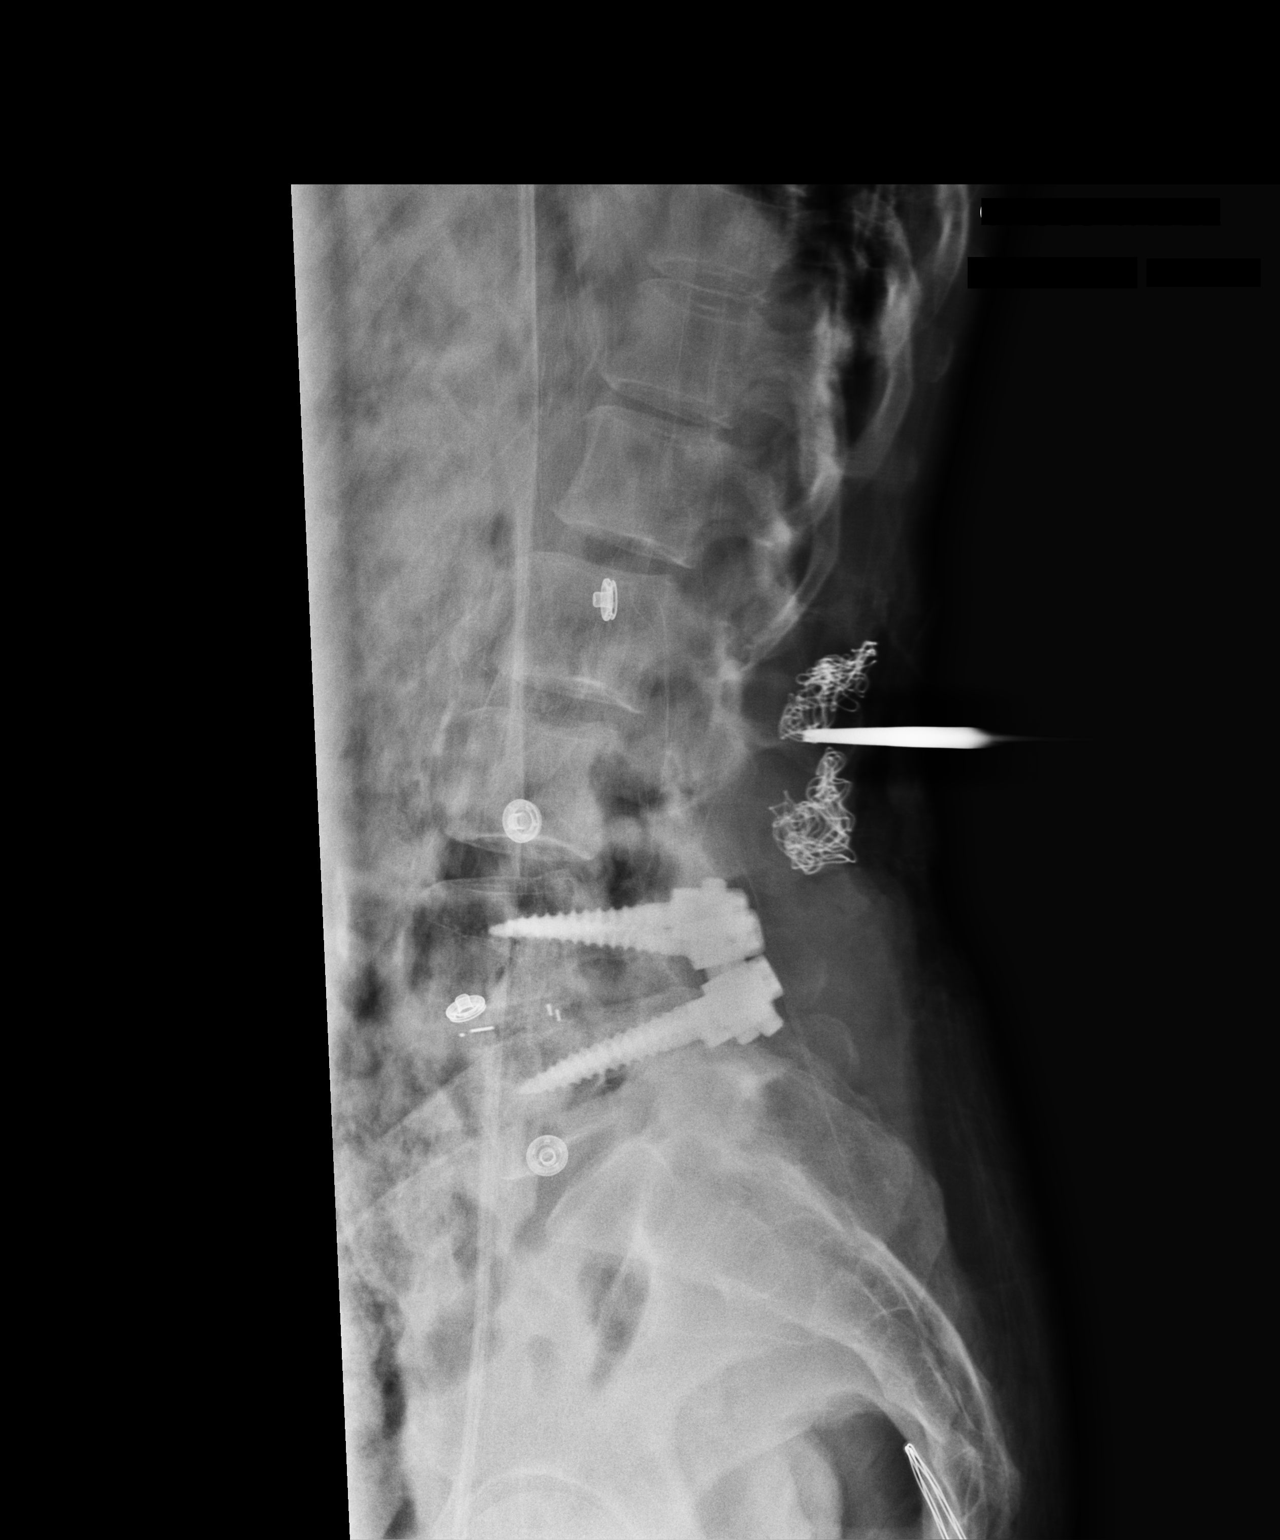

[xtable lateral (3 of 3)]
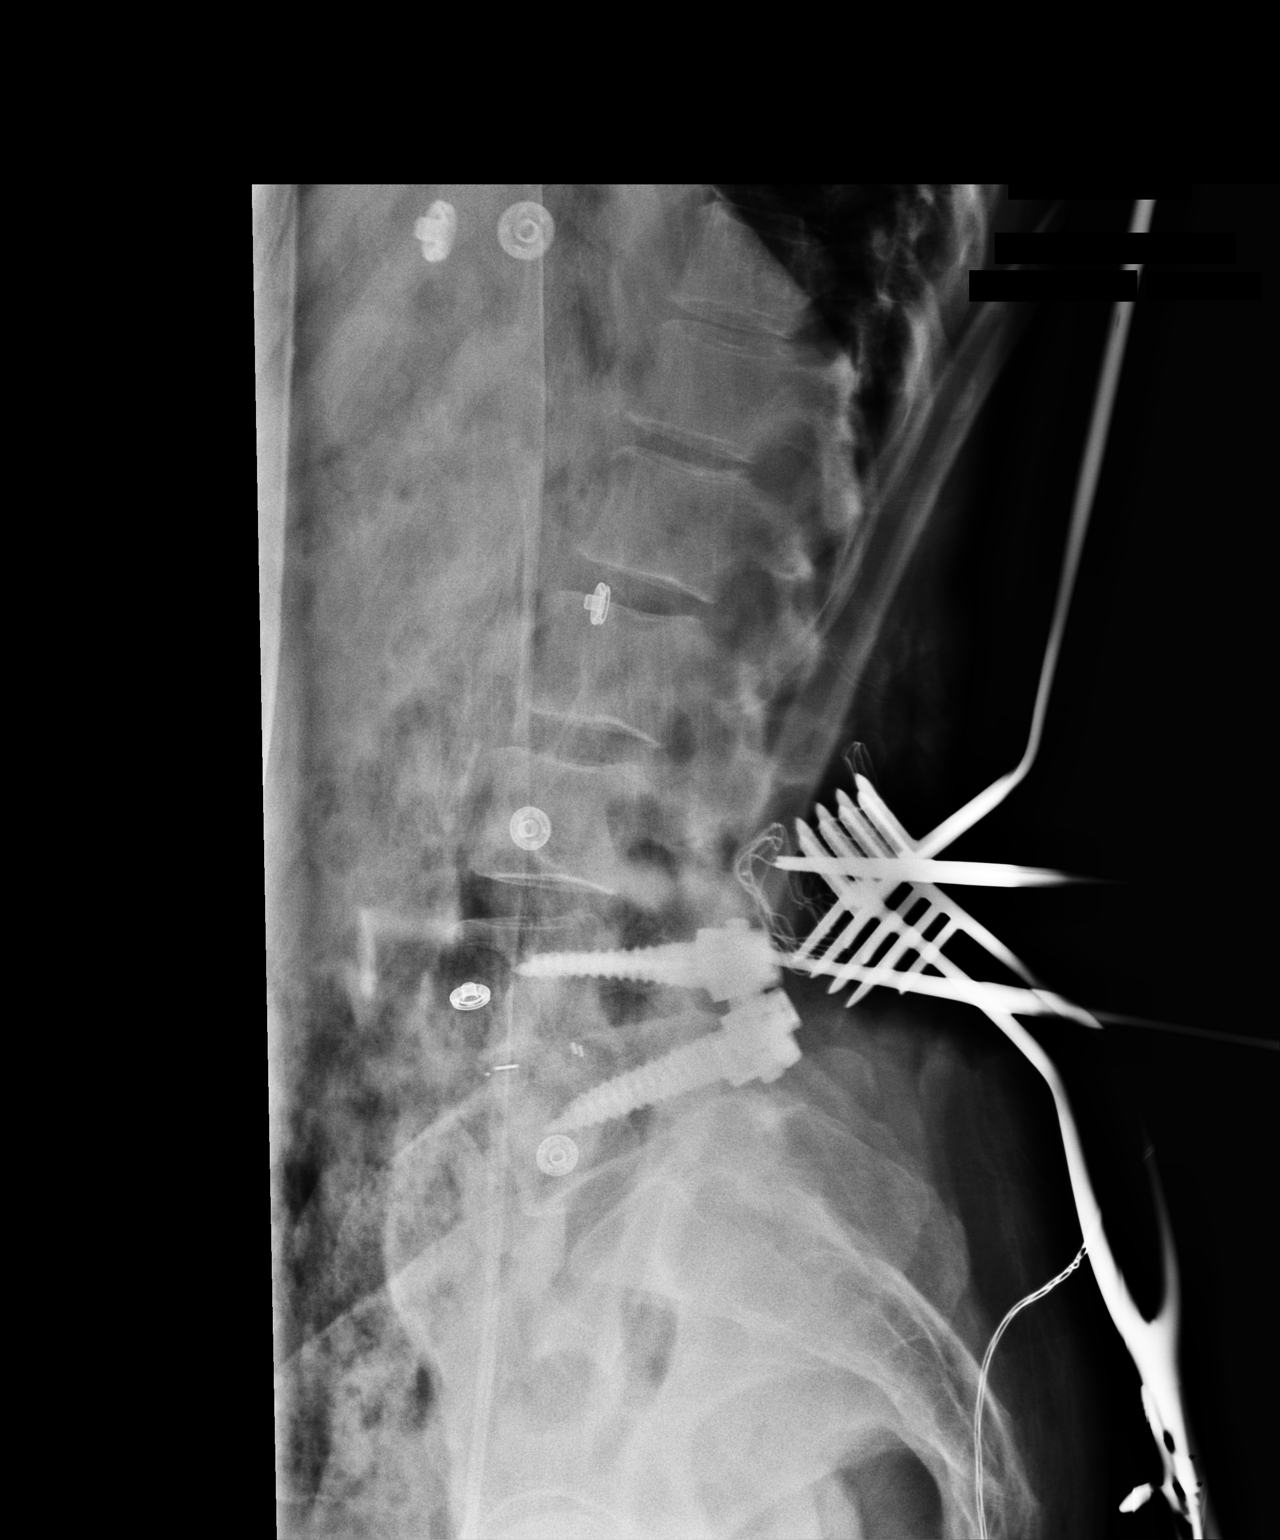

[3 of 3 positions shown; findings below may reference images not displayed]

FINDINGS: On the third image in the series metallic markers noted posteriorly
at L3 and L4. Patient has had prior L4-L5 posterior and interbody
fusion. Anatomic alignment.
IMPRESSION: Postsurgical changes as above .

## 2018-10-06 IMAGING — RF DG LUMBAR SPINE 2-3V
1 series · 2 of 2 positions shown · non-contrast
Comparison: 05/29/2016

CLINICAL DATA: L3-4 decompression and fusion

EXAM:
LUMBAR SPINE - 2-3 VIEW; DG C-ARM 61-120 MIN

[Series 1: run · 2 of 2 slices shown]
[im 1/2]
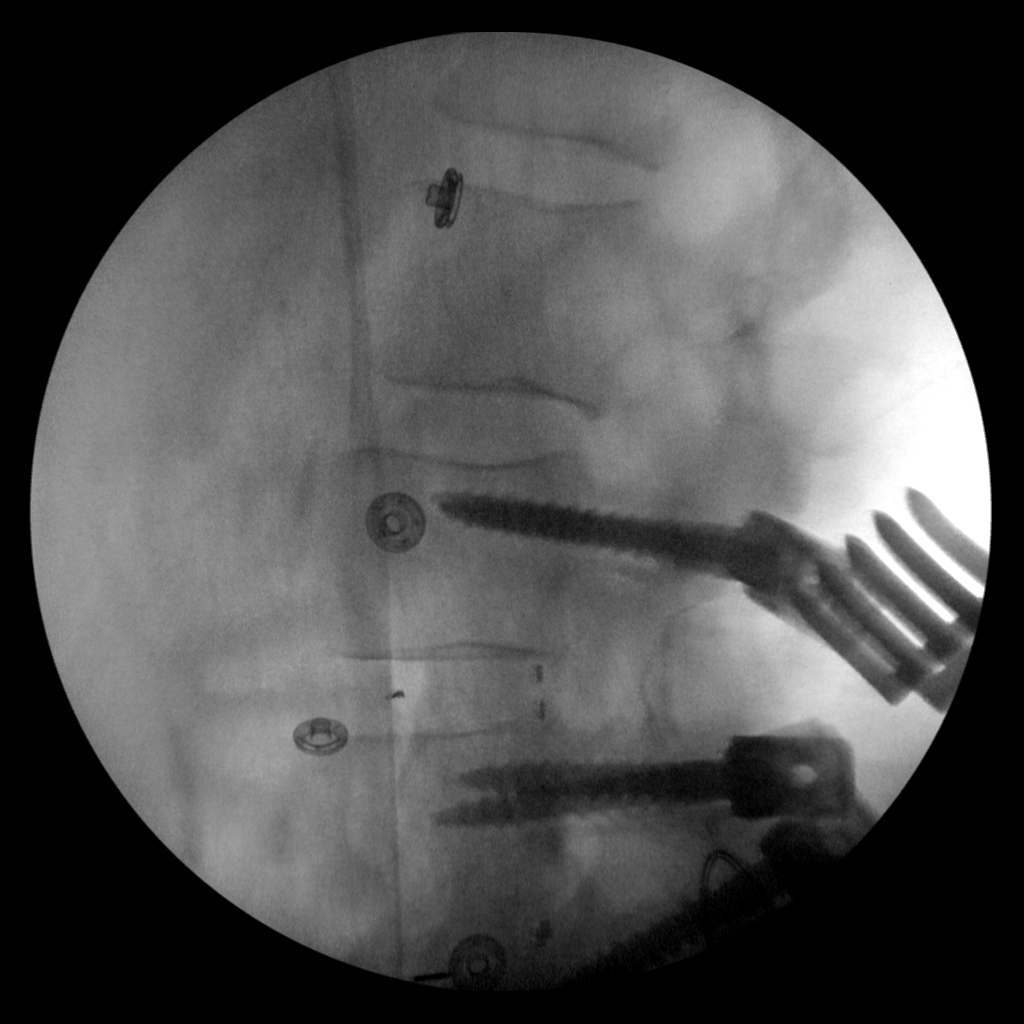
[im 2/2]
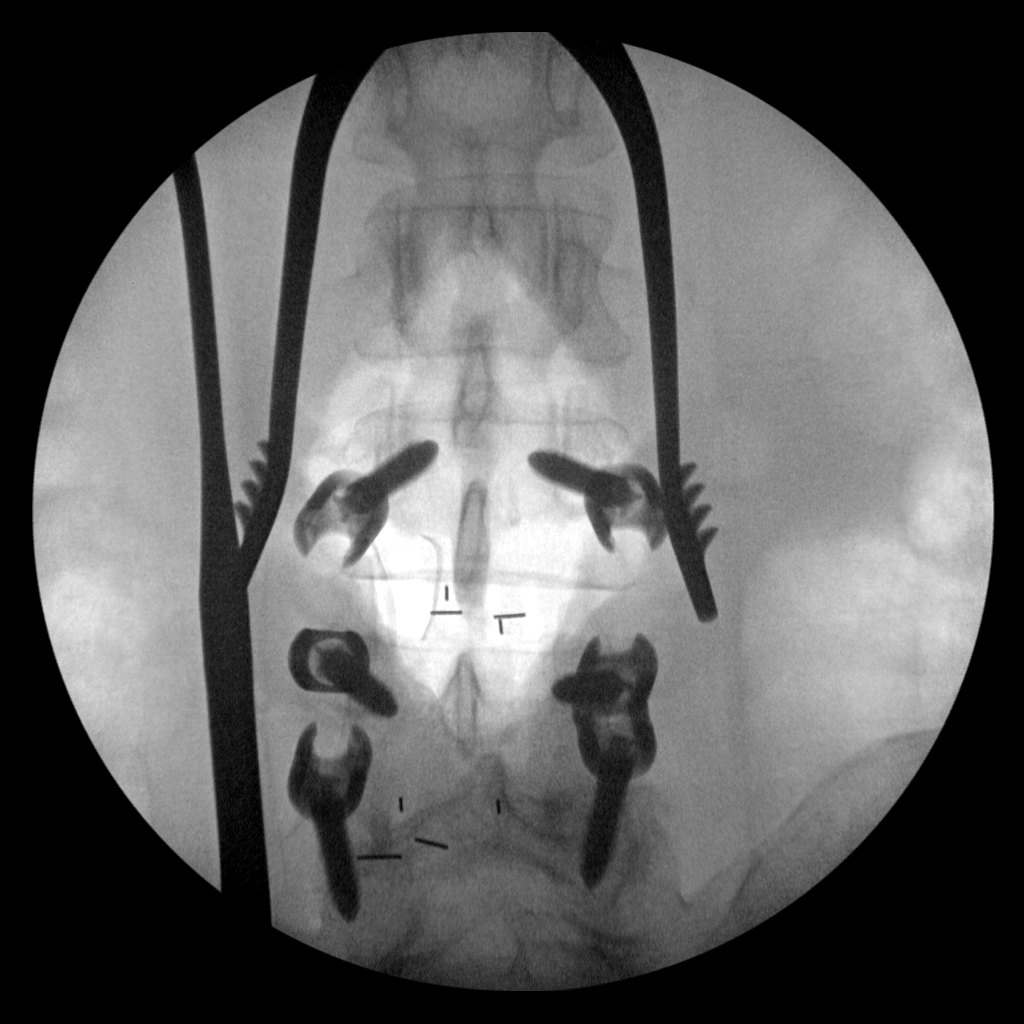

[2 of 2 positions shown; findings below may reference images not displayed]

FINDINGS: Two intraoperative spot images demonstrate pedicle screws noted at
L3, L4 and L5. The pedicle screws at L3 are new since prior study.
No visible complicating feature.
IMPRESSION: Intraoperative imaging as above.

## 2018-10-15 DIAGNOSIS — G894 Chronic pain syndrome: Secondary | ICD-10-CM | POA: Diagnosis not present

## 2018-10-15 DIAGNOSIS — M5416 Radiculopathy, lumbar region: Secondary | ICD-10-CM | POA: Diagnosis not present

## 2018-10-15 DIAGNOSIS — M62830 Muscle spasm of back: Secondary | ICD-10-CM | POA: Diagnosis not present

## 2018-10-15 DIAGNOSIS — M5136 Other intervertebral disc degeneration, lumbar region: Secondary | ICD-10-CM | POA: Diagnosis not present

## 2018-10-20 DIAGNOSIS — F900 Attention-deficit hyperactivity disorder, predominantly inattentive type: Secondary | ICD-10-CM | POA: Diagnosis not present

## 2018-10-25 ENCOUNTER — Other Ambulatory Visit: Payer: Self-pay

## 2018-10-25 ENCOUNTER — Encounter: Payer: Self-pay | Admitting: Obstetrics & Gynecology

## 2018-10-25 ENCOUNTER — Telehealth: Payer: Self-pay | Admitting: Obstetrics & Gynecology

## 2018-10-25 ENCOUNTER — Ambulatory Visit (INDEPENDENT_AMBULATORY_CARE_PROVIDER_SITE_OTHER): Payer: BLUE CROSS/BLUE SHIELD | Admitting: Obstetrics & Gynecology

## 2018-10-25 VITALS — BP 138/86 | HR 84 | Resp 16 | Ht 61.25 in | Wt 138.0 lb

## 2018-10-25 DIAGNOSIS — Z01419 Encounter for gynecological examination (general) (routine) without abnormal findings: Secondary | ICD-10-CM | POA: Diagnosis not present

## 2018-10-25 NOTE — Telephone Encounter (Signed)
I told her I would write this down and then we talked about other things--Replens vaginal moisturizer or OTC coconut oil.  Can you either vaginally twice weekly.  For a lubricant--Astroglide was my suggestion.  Thanks.

## 2018-10-25 NOTE — Telephone Encounter (Signed)
Spoke with patient, advised as seen below per Dr. Miller. Patient verbalizes understanding and is agreeable.   Encounter closed.  

## 2018-10-25 NOTE — Telephone Encounter (Signed)
Patient called requesting the names of the two over-the-counter medications Dr. Sabra Heck recommended for: 1. vaginal lubrication 2. thinning of the vaginal wall.

## 2018-10-25 NOTE — Telephone Encounter (Signed)
Dr. Sabra Heck -please advise on medications.
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
## 2018-11-12 DIAGNOSIS — M5136 Other intervertebral disc degeneration, lumbar region: Secondary | ICD-10-CM | POA: Diagnosis not present

## 2018-11-12 DIAGNOSIS — M62830 Muscle spasm of back: Secondary | ICD-10-CM | POA: Diagnosis not present

## 2018-11-12 DIAGNOSIS — M5416 Radiculopathy, lumbar region: Secondary | ICD-10-CM | POA: Diagnosis not present

## 2018-11-12 DIAGNOSIS — G894 Chronic pain syndrome: Secondary | ICD-10-CM | POA: Diagnosis not present

## 2018-12-09 DIAGNOSIS — G894 Chronic pain syndrome: Secondary | ICD-10-CM | POA: Diagnosis not present

## 2018-12-09 DIAGNOSIS — Z79891 Long term (current) use of opiate analgesic: Secondary | ICD-10-CM | POA: Diagnosis not present

## 2018-12-09 DIAGNOSIS — M62830 Muscle spasm of back: Secondary | ICD-10-CM | POA: Diagnosis not present

## 2018-12-09 DIAGNOSIS — M5136 Other intervertebral disc degeneration, lumbar region: Secondary | ICD-10-CM | POA: Diagnosis not present

## 2018-12-09 DIAGNOSIS — M5416 Radiculopathy, lumbar region: Secondary | ICD-10-CM | POA: Diagnosis not present

## 2019-01-07 DIAGNOSIS — M5416 Radiculopathy, lumbar region: Secondary | ICD-10-CM | POA: Diagnosis not present

## 2019-01-07 DIAGNOSIS — M62830 Muscle spasm of back: Secondary | ICD-10-CM | POA: Diagnosis not present

## 2019-01-07 DIAGNOSIS — G894 Chronic pain syndrome: Secondary | ICD-10-CM | POA: Diagnosis not present

## 2019-01-07 DIAGNOSIS — M5136 Other intervertebral disc degeneration, lumbar region: Secondary | ICD-10-CM | POA: Diagnosis not present

## 2019-01-18 DIAGNOSIS — F900 Attention-deficit hyperactivity disorder, predominantly inattentive type: Secondary | ICD-10-CM | POA: Diagnosis not present

## 2019-01-19 DIAGNOSIS — H10413 Chronic giant papillary conjunctivitis, bilateral: Secondary | ICD-10-CM | POA: Diagnosis not present

## 2019-02-04 DIAGNOSIS — M62830 Muscle spasm of back: Secondary | ICD-10-CM | POA: Diagnosis not present

## 2019-02-04 DIAGNOSIS — G894 Chronic pain syndrome: Secondary | ICD-10-CM | POA: Diagnosis not present

## 2019-02-04 DIAGNOSIS — M5416 Radiculopathy, lumbar region: Secondary | ICD-10-CM | POA: Diagnosis not present

## 2019-02-04 DIAGNOSIS — M5136 Other intervertebral disc degeneration, lumbar region: Secondary | ICD-10-CM | POA: Diagnosis not present

## 2019-02-24 DIAGNOSIS — M62830 Muscle spasm of back: Secondary | ICD-10-CM | POA: Diagnosis not present

## 2019-02-24 DIAGNOSIS — M5136 Other intervertebral disc degeneration, lumbar region: Secondary | ICD-10-CM | POA: Diagnosis not present

## 2019-02-24 DIAGNOSIS — M5416 Radiculopathy, lumbar region: Secondary | ICD-10-CM | POA: Diagnosis not present

## 2019-02-24 DIAGNOSIS — G894 Chronic pain syndrome: Secondary | ICD-10-CM | POA: Diagnosis not present

## 2019-03-28 ENCOUNTER — Encounter: Payer: Self-pay | Admitting: Obstetrics & Gynecology

## 2019-03-28 DIAGNOSIS — I10 Essential (primary) hypertension: Secondary | ICD-10-CM | POA: Diagnosis not present

## 2019-03-28 DIAGNOSIS — K5120 Ulcerative (chronic) proctitis without complications: Secondary | ICD-10-CM | POA: Diagnosis not present

## 2019-03-28 DIAGNOSIS — Z1231 Encounter for screening mammogram for malignant neoplasm of breast: Secondary | ICD-10-CM | POA: Diagnosis not present

## 2019-03-28 DIAGNOSIS — M549 Dorsalgia, unspecified: Secondary | ICD-10-CM | POA: Diagnosis not present

## 2019-03-28 DIAGNOSIS — E785 Hyperlipidemia, unspecified: Secondary | ICD-10-CM | POA: Diagnosis not present

## 2019-03-31 DIAGNOSIS — M62830 Muscle spasm of back: Secondary | ICD-10-CM | POA: Diagnosis not present

## 2019-03-31 DIAGNOSIS — M5416 Radiculopathy, lumbar region: Secondary | ICD-10-CM | POA: Diagnosis not present

## 2019-03-31 DIAGNOSIS — G894 Chronic pain syndrome: Secondary | ICD-10-CM | POA: Diagnosis not present

## 2019-03-31 DIAGNOSIS — M5136 Other intervertebral disc degeneration, lumbar region: Secondary | ICD-10-CM | POA: Diagnosis not present

## 2019-04-05 DIAGNOSIS — Z8249 Family history of ischemic heart disease and other diseases of the circulatory system: Secondary | ICD-10-CM | POA: Diagnosis not present

## 2019-04-05 DIAGNOSIS — E785 Hyperlipidemia, unspecified: Secondary | ICD-10-CM | POA: Diagnosis not present

## 2019-04-05 DIAGNOSIS — I10 Essential (primary) hypertension: Secondary | ICD-10-CM | POA: Diagnosis not present

## 2019-04-19 DIAGNOSIS — M4316 Spondylolisthesis, lumbar region: Secondary | ICD-10-CM | POA: Diagnosis not present

## 2019-04-19 DIAGNOSIS — Z981 Arthrodesis status: Secondary | ICD-10-CM | POA: Diagnosis not present

## 2019-04-19 DIAGNOSIS — M5136 Other intervertebral disc degeneration, lumbar region: Secondary | ICD-10-CM | POA: Diagnosis not present

## 2019-04-19 DIAGNOSIS — M47816 Spondylosis without myelopathy or radiculopathy, lumbar region: Secondary | ICD-10-CM | POA: Diagnosis not present

## 2019-04-25 DIAGNOSIS — E7800 Pure hypercholesterolemia, unspecified: Secondary | ICD-10-CM | POA: Diagnosis not present

## 2019-04-25 DIAGNOSIS — K30 Functional dyspepsia: Secondary | ICD-10-CM | POA: Diagnosis not present

## 2019-04-28 DIAGNOSIS — M62830 Muscle spasm of back: Secondary | ICD-10-CM | POA: Diagnosis not present

## 2019-04-28 DIAGNOSIS — G894 Chronic pain syndrome: Secondary | ICD-10-CM | POA: Diagnosis not present

## 2019-04-28 DIAGNOSIS — M5416 Radiculopathy, lumbar region: Secondary | ICD-10-CM | POA: Diagnosis not present

## 2019-04-28 DIAGNOSIS — M5136 Other intervertebral disc degeneration, lumbar region: Secondary | ICD-10-CM | POA: Diagnosis not present

## 2019-05-06 ENCOUNTER — Ambulatory Visit: Payer: BLUE CROSS/BLUE SHIELD | Admitting: Nurse Practitioner

## 2019-05-14 DIAGNOSIS — Z23 Encounter for immunization: Secondary | ICD-10-CM | POA: Diagnosis not present

## 2019-05-26 DIAGNOSIS — M62830 Muscle spasm of back: Secondary | ICD-10-CM | POA: Diagnosis not present

## 2019-05-26 DIAGNOSIS — M5136 Other intervertebral disc degeneration, lumbar region: Secondary | ICD-10-CM | POA: Diagnosis not present

## 2019-05-26 DIAGNOSIS — G894 Chronic pain syndrome: Secondary | ICD-10-CM | POA: Diagnosis not present

## 2019-05-26 DIAGNOSIS — M5416 Radiculopathy, lumbar region: Secondary | ICD-10-CM | POA: Diagnosis not present

## 2019-06-23 DIAGNOSIS — M5136 Other intervertebral disc degeneration, lumbar region: Secondary | ICD-10-CM | POA: Diagnosis not present

## 2019-06-23 DIAGNOSIS — G894 Chronic pain syndrome: Secondary | ICD-10-CM | POA: Diagnosis not present

## 2019-06-23 DIAGNOSIS — M62830 Muscle spasm of back: Secondary | ICD-10-CM | POA: Diagnosis not present

## 2019-06-23 DIAGNOSIS — M5416 Radiculopathy, lumbar region: Secondary | ICD-10-CM | POA: Diagnosis not present

## 2019-07-07 DIAGNOSIS — E7800 Pure hypercholesterolemia, unspecified: Secondary | ICD-10-CM | POA: Diagnosis not present

## 2019-07-07 DIAGNOSIS — Z79899 Other long term (current) drug therapy: Secondary | ICD-10-CM | POA: Diagnosis not present

## 2019-07-21 DIAGNOSIS — M62830 Muscle spasm of back: Secondary | ICD-10-CM | POA: Diagnosis not present

## 2019-07-21 DIAGNOSIS — M5416 Radiculopathy, lumbar region: Secondary | ICD-10-CM | POA: Diagnosis not present

## 2019-07-21 DIAGNOSIS — G894 Chronic pain syndrome: Secondary | ICD-10-CM | POA: Diagnosis not present

## 2019-07-21 DIAGNOSIS — M5136 Other intervertebral disc degeneration, lumbar region: Secondary | ICD-10-CM | POA: Diagnosis not present

## 2019-08-18 DIAGNOSIS — M5136 Other intervertebral disc degeneration, lumbar region: Secondary | ICD-10-CM | POA: Diagnosis not present

## 2019-08-18 DIAGNOSIS — G894 Chronic pain syndrome: Secondary | ICD-10-CM | POA: Diagnosis not present

## 2019-08-18 DIAGNOSIS — M5416 Radiculopathy, lumbar region: Secondary | ICD-10-CM | POA: Diagnosis not present

## 2019-08-18 DIAGNOSIS — M62830 Muscle spasm of back: Secondary | ICD-10-CM | POA: Diagnosis not present

## 2019-09-15 DIAGNOSIS — G894 Chronic pain syndrome: Secondary | ICD-10-CM | POA: Diagnosis not present

## 2019-09-15 DIAGNOSIS — M5416 Radiculopathy, lumbar region: Secondary | ICD-10-CM | POA: Diagnosis not present

## 2019-09-15 DIAGNOSIS — M5136 Other intervertebral disc degeneration, lumbar region: Secondary | ICD-10-CM | POA: Diagnosis not present

## 2019-09-15 DIAGNOSIS — M62830 Muscle spasm of back: Secondary | ICD-10-CM | POA: Diagnosis not present

## 2019-09-19 ENCOUNTER — Encounter: Payer: Self-pay | Admitting: Internal Medicine

## 2019-09-20 ENCOUNTER — Telehealth: Payer: Self-pay | Admitting: Internal Medicine

## 2019-09-20 NOTE — Telephone Encounter (Signed)
LM for patient to call back to schedule recall colonoscopy per Dr. Celesta Aver recall assessment

## 2019-10-13 DIAGNOSIS — M5416 Radiculopathy, lumbar region: Secondary | ICD-10-CM | POA: Diagnosis not present

## 2019-10-13 DIAGNOSIS — G894 Chronic pain syndrome: Secondary | ICD-10-CM | POA: Diagnosis not present

## 2019-10-13 DIAGNOSIS — M5136 Other intervertebral disc degeneration, lumbar region: Secondary | ICD-10-CM | POA: Diagnosis not present

## 2019-10-13 DIAGNOSIS — M62830 Muscle spasm of back: Secondary | ICD-10-CM | POA: Diagnosis not present

## 2019-10-18 DIAGNOSIS — M4316 Spondylolisthesis, lumbar region: Secondary | ICD-10-CM | POA: Diagnosis not present

## 2019-10-18 DIAGNOSIS — Z981 Arthrodesis status: Secondary | ICD-10-CM | POA: Diagnosis not present

## 2019-10-18 DIAGNOSIS — M47816 Spondylosis without myelopathy or radiculopathy, lumbar region: Secondary | ICD-10-CM | POA: Diagnosis not present

## 2019-10-18 DIAGNOSIS — M545 Low back pain: Secondary | ICD-10-CM | POA: Diagnosis not present

## 2019-10-18 DIAGNOSIS — G8929 Other chronic pain: Secondary | ICD-10-CM | POA: Diagnosis not present

## 2019-11-10 DIAGNOSIS — G894 Chronic pain syndrome: Secondary | ICD-10-CM | POA: Diagnosis not present

## 2019-11-10 DIAGNOSIS — M5136 Other intervertebral disc degeneration, lumbar region: Secondary | ICD-10-CM | POA: Diagnosis not present

## 2019-11-10 DIAGNOSIS — M62830 Muscle spasm of back: Secondary | ICD-10-CM | POA: Diagnosis not present

## 2019-11-10 DIAGNOSIS — M5416 Radiculopathy, lumbar region: Secondary | ICD-10-CM | POA: Diagnosis not present

## 2019-12-08 DIAGNOSIS — M5136 Other intervertebral disc degeneration, lumbar region: Secondary | ICD-10-CM | POA: Diagnosis not present

## 2019-12-08 DIAGNOSIS — M5416 Radiculopathy, lumbar region: Secondary | ICD-10-CM | POA: Diagnosis not present

## 2019-12-08 DIAGNOSIS — G894 Chronic pain syndrome: Secondary | ICD-10-CM | POA: Diagnosis not present

## 2019-12-08 DIAGNOSIS — M62830 Muscle spasm of back: Secondary | ICD-10-CM | POA: Diagnosis not present

## 2020-01-05 DIAGNOSIS — M5416 Radiculopathy, lumbar region: Secondary | ICD-10-CM | POA: Diagnosis not present

## 2020-01-05 DIAGNOSIS — G894 Chronic pain syndrome: Secondary | ICD-10-CM | POA: Diagnosis not present

## 2020-01-05 DIAGNOSIS — M5136 Other intervertebral disc degeneration, lumbar region: Secondary | ICD-10-CM | POA: Diagnosis not present

## 2020-01-05 DIAGNOSIS — M62830 Muscle spasm of back: Secondary | ICD-10-CM | POA: Diagnosis not present

## 2020-02-02 DIAGNOSIS — K5900 Constipation, unspecified: Secondary | ICD-10-CM | POA: Diagnosis not present

## 2020-02-02 DIAGNOSIS — G894 Chronic pain syndrome: Secondary | ICD-10-CM | POA: Diagnosis not present

## 2020-02-02 DIAGNOSIS — M47817 Spondylosis without myelopathy or radiculopathy, lumbosacral region: Secondary | ICD-10-CM | POA: Diagnosis not present

## 2020-02-02 DIAGNOSIS — M62830 Muscle spasm of back: Secondary | ICD-10-CM | POA: Diagnosis not present

## 2020-02-02 DIAGNOSIS — M5416 Radiculopathy, lumbar region: Secondary | ICD-10-CM | POA: Diagnosis not present

## 2020-02-02 DIAGNOSIS — M5136 Other intervertebral disc degeneration, lumbar region: Secondary | ICD-10-CM | POA: Diagnosis not present

## 2020-03-01 DIAGNOSIS — M5136 Other intervertebral disc degeneration, lumbar region: Secondary | ICD-10-CM | POA: Diagnosis not present

## 2020-03-01 DIAGNOSIS — G894 Chronic pain syndrome: Secondary | ICD-10-CM | POA: Diagnosis not present

## 2020-03-01 DIAGNOSIS — M5416 Radiculopathy, lumbar region: Secondary | ICD-10-CM | POA: Diagnosis not present

## 2020-03-01 DIAGNOSIS — M62830 Muscle spasm of back: Secondary | ICD-10-CM | POA: Diagnosis not present

## 2020-03-12 NOTE — Progress Notes (Signed)
59 y.o. G0P0 Married White or Caucasian female here for annual exam.  Doing well.  Denies vaginal bleeding.  Dr. Rita Ohara retired but she is seeing new Publishing rights manager.  Continues to have back issues.  L1 and L2 are causing issues.  She's not in any hurry to have this done.    Patient's last menstrual period was 03/02/2011.          Sexually active: Yes.    The current method of family planning is post menopausal status.    Exercising: Yes.    walking Smoker:  no  Health Maintenance: Pap:  06-11-17 neg HPV HR neg History of abnormal Pap:  no MMG:  03-28-2019 category a density birads 2:neg Colonoscopy:  08-17-09 BMD:   02-13-17 osteopenia TDaP:  2013 Pneumonia vaccine(s):  Reviewed with pt today Shingrix:   Not done Hep C testing: neg 2017 Screening Labs: has appt later this month.  cholesterol is being followed.   reports that she has quit smoking. She has never used smokeless tobacco. She reports that she does not drink alcohol and does not use drugs.  Past Medical History:  Diagnosis Date  . Arthritis    spine/hands  . Chronic back pain    spondylolisthesis  . History of bronchitis 2 yrs ago  . History of shingles   . Hypertension    takes Lotrel daily  . Internal and external hemorrhoids without complication   . Migraines    better with controlling hypertension  . Muscle spasm    takes Flexeril daily as needed  . 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 ANTERIOR CRUCIATE LIGAMENT REPAIR Left   . BACK SURGERY  10/01/2015, 12/18  . BREAST ENHANCEMENT SURGERY     replaced a second time 9/11  . BREAST REDUCTION SURGERY  03/03/2018  . BUNIONECTOMY Left   . COLONOSCOPY W/ BIOPSIES  08/17/2009  . Spinal Cyst Aspiration     back and right side of face    Current Outpatient Medications  Medication Sig Dispense Refill  . amlodipine-benazepril  (LOTREL) 2.5-10 MG per capsule Take 1 capsule by mouth daily.    Marland Kitchen augmented betamethasone dipropionate (DIPROLENE-AF) 0.05 % cream APPLY TO AFFECTED AREA 2 TO 3 TIMES A DAY WHEN FLARED FOR 2 TO 3 WEEKS  0  . Calcium-Magnesium-Vitamin D (CALCIUM 500 PO) Take 1 tablet by mouth daily.     . CONCERTA 54 MG CR tablet Take 54 mg by mouth every morning.  0  . DENTA 5000 PLUS 1.1 % CREA dental cream Place 1 application onto teeth at bedtime.     . diclofenac sodium (VOLTAREN) 1 % GEL Apply 2 g topically 3 (three) times daily as needed (for pain).     . DULoxetine (CYMBALTA) 60 MG capsule Take 60 mg by mouth daily.    Marland Kitchen EPINEPHrine (EPIPEN JR) 0.15 MG/0.3ML injection Inject 0.15 mg into the muscle as needed for anaphylaxis.     . Gabapentin, Once-Daily, (GRALISE) 300 MG TABS Take 1 tablet by mouth daily.    . mesalamine (CANASA) 1000 MG suppository Place 1 suppository (1,000 mg total) rectally daily as needed. For flare ups 30 suppository 3  . Multiple Vitamin (MULTIVITAMIN) tablet Take 1 tablet by mouth daily.    Marland Kitchen PERCOCET 10-325 MG tablet Take 1 tablet by mouth every 4 (four) hours as needed.    Marland Kitchen  traZODone (DESYREL) 50 MG tablet TAKE 1, 2, OR 3 TABLETS AT BEDTIME FOR SLEEP DISTURBANCE  3  . VITAMIN D, CHOLECALCIFEROL, PO Take 4,000 Units by mouth daily.     No current facility-administered medications for this visit.    Family History  Problem Relation Age of Onset  . Lymphoma Mother 67  . Parkinson's disease Father   . Hypertension Father     Review of Systems  Constitutional: Negative.   HENT: Negative.   Eyes: Negative.   Respiratory: Negative.   Cardiovascular: Negative.   Gastrointestinal: Negative.   Endocrine: Negative.   Genitourinary: Negative.   Musculoskeletal: Negative.   Skin: Negative.   Allergic/Immunologic: Negative.   Neurological: Negative.   Hematological: Negative.   Psychiatric/Behavioral: Negative.     Exam:   BP 114/68   Pulse 68   Resp 16   Ht 5'  0.75" (1.543 m)   Wt 124 lb (56.2 kg)   LMP 03/02/2011   BMI 23.62 kg/m   Height: 5' 0.75" (154.3 cm)  General appearance: alert, cooperative and appears stated age Head: Normocephalic, without obvious abnormality, atraumatic Neck: no adenopathy, supple, symmetrical, trachea midline and thyroid normal to inspection and palpation Lungs: clear to auscultation bilaterally Breasts: normal appearance, no masses or tenderness Heart: regular rate and rhythm Abdomen: soft, non-tender; bowel sounds normal; no masses,  no organomegaly Extremities: extremities normal, atraumatic, no cyanosis or edema Skin: Skin color, texture, turgor normal. No rashes or lesions Lymph nodes: Cervical, supraclavicular, and axillary nodes normal. No abnormal inguinal nodes palpated Neurologic: Grossly normal   Pelvic: External genitalia:  no lesions              Urethra:  normal appearing urethra with no masses, tenderness or lesions              Bartholins and Skenes: normal                 Vagina: normal appearing vagina with normal color and discharge, no lesions              Cervix: no lesions              Pap taken: Yes.   Bimanual Exam:  Uterus:  normal size, contour, position, consistency, mobility, non-tender              Adnexa: normal adnexa and no mass, fullness, tenderness               Rectovaginal: Confirms               Anus:  normal sphincter tone, no lesions  Chaperone, Royal Hawthorn, CMA, was present for exam.  A:  Well Woman with normal exam Pmp, no HRT H/o ulcerative proctitis, followed by Dr. Carlean Purl H/o back pain, surgery x 2 H/o left implant rupture, s/p removal with new implants Migraines/mildly elevated blood pressure Elevated lipids Osteopenia  P:   Mammogram guidelines reviewed pap smear with HR HPV obtained today Colonoscopy is due.  She is aware this is due and will have it done this year. Plan BMD next year Blood work scheduled for the end of the month with Dr.  Marisue Humble Shingrix vaccination discussed again today Return annually or prn

## 2020-03-13 ENCOUNTER — Encounter: Payer: Self-pay | Admitting: Obstetrics & Gynecology

## 2020-03-13 ENCOUNTER — Ambulatory Visit (INDEPENDENT_AMBULATORY_CARE_PROVIDER_SITE_OTHER): Payer: BC Managed Care – PPO | Admitting: Obstetrics & Gynecology

## 2020-03-13 ENCOUNTER — Other Ambulatory Visit (HOSPITAL_COMMUNITY)
Admission: RE | Admit: 2020-03-13 | Discharge: 2020-03-13 | Disposition: A | Discharge status: Home / Self Care | Payer: BC Managed Care – PPO | Source: Ambulatory Visit | Attending: Obstetrics & Gynecology | Admitting: Obstetrics & Gynecology

## 2020-03-13 ENCOUNTER — Other Ambulatory Visit: Payer: Self-pay

## 2020-03-13 VITALS — BP 114/68 | HR 68 | Resp 16 | Ht 60.75 in | Wt 124.0 lb

## 2020-03-13 DIAGNOSIS — Z01419 Encounter for gynecological examination (general) (routine) without abnormal findings: Secondary | ICD-10-CM

## 2020-03-13 DIAGNOSIS — Z124 Encounter for screening for malignant neoplasm of cervix: Secondary | ICD-10-CM | POA: Diagnosis not present

## 2020-03-16 LAB — CYTOLOGY - PAP
Comment: NEGATIVE
Diagnosis: NEGATIVE
High risk HPV: NEGATIVE

## 2020-03-27 DIAGNOSIS — I10 Essential (primary) hypertension: Secondary | ICD-10-CM | POA: Diagnosis not present

## 2020-03-27 DIAGNOSIS — E7800 Pure hypercholesterolemia, unspecified: Secondary | ICD-10-CM | POA: Diagnosis not present

## 2020-03-29 DIAGNOSIS — M5416 Radiculopathy, lumbar region: Secondary | ICD-10-CM | POA: Diagnosis not present

## 2020-03-29 DIAGNOSIS — M5136 Other intervertebral disc degeneration, lumbar region: Secondary | ICD-10-CM | POA: Diagnosis not present

## 2020-03-29 DIAGNOSIS — M62830 Muscle spasm of back: Secondary | ICD-10-CM | POA: Diagnosis not present

## 2020-03-29 DIAGNOSIS — G894 Chronic pain syndrome: Secondary | ICD-10-CM | POA: Diagnosis not present

## 2020-04-02 DIAGNOSIS — Z1231 Encounter for screening mammogram for malignant neoplasm of breast: Secondary | ICD-10-CM | POA: Diagnosis not present

## 2020-04-05 DIAGNOSIS — I10 Essential (primary) hypertension: Secondary | ICD-10-CM | POA: Diagnosis not present

## 2020-04-05 DIAGNOSIS — E785 Hyperlipidemia, unspecified: Secondary | ICD-10-CM | POA: Diagnosis not present

## 2020-04-05 DIAGNOSIS — M549 Dorsalgia, unspecified: Secondary | ICD-10-CM | POA: Diagnosis not present

## 2020-04-05 DIAGNOSIS — K5120 Ulcerative (chronic) proctitis without complications: Secondary | ICD-10-CM | POA: Diagnosis not present

## 2020-04-17 DIAGNOSIS — M5136 Other intervertebral disc degeneration, lumbar region: Secondary | ICD-10-CM | POA: Diagnosis not present

## 2020-04-17 DIAGNOSIS — M4316 Spondylolisthesis, lumbar region: Secondary | ICD-10-CM | POA: Diagnosis not present

## 2020-04-17 DIAGNOSIS — Z981 Arthrodesis status: Secondary | ICD-10-CM | POA: Diagnosis not present

## 2020-04-26 DIAGNOSIS — M62830 Muscle spasm of back: Secondary | ICD-10-CM | POA: Diagnosis not present

## 2020-04-26 DIAGNOSIS — M5416 Radiculopathy, lumbar region: Secondary | ICD-10-CM | POA: Diagnosis not present

## 2020-04-26 DIAGNOSIS — M5136 Other intervertebral disc degeneration, lumbar region: Secondary | ICD-10-CM | POA: Diagnosis not present

## 2020-04-26 DIAGNOSIS — G894 Chronic pain syndrome: Secondary | ICD-10-CM | POA: Diagnosis not present

## 2020-05-04 ENCOUNTER — Encounter: Payer: Self-pay | Admitting: Obstetrics & Gynecology

## 2020-05-24 DIAGNOSIS — Z79891 Long term (current) use of opiate analgesic: Secondary | ICD-10-CM | POA: Diagnosis not present

## 2020-05-24 DIAGNOSIS — M5136 Other intervertebral disc degeneration, lumbar region: Secondary | ICD-10-CM | POA: Diagnosis not present

## 2020-05-24 DIAGNOSIS — M5416 Radiculopathy, lumbar region: Secondary | ICD-10-CM | POA: Diagnosis not present

## 2020-05-24 DIAGNOSIS — M62830 Muscle spasm of back: Secondary | ICD-10-CM | POA: Diagnosis not present

## 2020-05-24 DIAGNOSIS — G894 Chronic pain syndrome: Secondary | ICD-10-CM | POA: Diagnosis not present

## 2020-06-14 DIAGNOSIS — M47816 Spondylosis without myelopathy or radiculopathy, lumbar region: Secondary | ICD-10-CM | POA: Diagnosis not present

## 2020-06-21 DIAGNOSIS — M62830 Muscle spasm of back: Secondary | ICD-10-CM | POA: Diagnosis not present

## 2020-06-21 DIAGNOSIS — G894 Chronic pain syndrome: Secondary | ICD-10-CM | POA: Diagnosis not present

## 2020-06-21 DIAGNOSIS — M5136 Other intervertebral disc degeneration, lumbar region: Secondary | ICD-10-CM | POA: Diagnosis not present

## 2020-06-21 DIAGNOSIS — M5416 Radiculopathy, lumbar region: Secondary | ICD-10-CM | POA: Diagnosis not present

## 2020-06-26 DIAGNOSIS — Z79899 Other long term (current) drug therapy: Secondary | ICD-10-CM | POA: Diagnosis not present

## 2020-06-26 DIAGNOSIS — E785 Hyperlipidemia, unspecified: Secondary | ICD-10-CM | POA: Diagnosis not present

## 2020-08-07 ENCOUNTER — Encounter: Payer: Self-pay | Admitting: Internal Medicine

## 2020-08-16 DIAGNOSIS — G894 Chronic pain syndrome: Secondary | ICD-10-CM | POA: Diagnosis not present

## 2020-08-16 DIAGNOSIS — M62830 Muscle spasm of back: Secondary | ICD-10-CM | POA: Diagnosis not present

## 2020-08-16 DIAGNOSIS — M5136 Other intervertebral disc degeneration, lumbar region: Secondary | ICD-10-CM | POA: Diagnosis not present

## 2020-08-16 DIAGNOSIS — M5416 Radiculopathy, lumbar region: Secondary | ICD-10-CM | POA: Diagnosis not present

## 2020-08-17 DIAGNOSIS — M79642 Pain in left hand: Secondary | ICD-10-CM | POA: Diagnosis not present

## 2020-08-17 DIAGNOSIS — M1812 Unilateral primary osteoarthritis of first carpometacarpal joint, left hand: Secondary | ICD-10-CM | POA: Diagnosis not present

## 2020-09-13 DIAGNOSIS — M5136 Other intervertebral disc degeneration, lumbar region: Secondary | ICD-10-CM | POA: Diagnosis not present

## 2020-09-13 DIAGNOSIS — G894 Chronic pain syndrome: Secondary | ICD-10-CM | POA: Diagnosis not present

## 2020-09-13 DIAGNOSIS — M62830 Muscle spasm of back: Secondary | ICD-10-CM | POA: Diagnosis not present

## 2020-09-13 DIAGNOSIS — M5416 Radiculopathy, lumbar region: Secondary | ICD-10-CM | POA: Diagnosis not present

## 2020-10-02 DIAGNOSIS — M5136 Other intervertebral disc degeneration, lumbar region: Secondary | ICD-10-CM | POA: Diagnosis not present

## 2020-10-02 DIAGNOSIS — M4316 Spondylolisthesis, lumbar region: Secondary | ICD-10-CM | POA: Diagnosis not present

## 2020-10-08 ENCOUNTER — Ambulatory Visit (AMBULATORY_SURGERY_CENTER): Payer: Self-pay | Admitting: *Deleted

## 2020-10-08 ENCOUNTER — Other Ambulatory Visit: Payer: Self-pay

## 2020-10-08 VITALS — Ht 60.75 in | Wt 127.0 lb

## 2020-10-08 DIAGNOSIS — Z1211 Encounter for screening for malignant neoplasm of colon: Secondary | ICD-10-CM

## 2020-10-08 NOTE — Progress Notes (Signed)
No egg or soy allergy known to patient  No issues with past sedation with any surgeries or procedures No intubation problems in the past  No FH of Malignant Hyperthermia No diet pills per patient No home 02 use per patient  No blood thinners per patient  Pt denies issues with constipation  No A fib or A flutter  EMMI video to pt or via [place] 19 guidelines implemented in PV today with Pt and RN  Pt is fully vaccinated  for Covid    Due to the COVID-19 pandemic we are asking patients to follow certain guidelines.  Pt aware of COVID protocols and [facility] guidelines

## 2020-10-11 DIAGNOSIS — M5416 Radiculopathy, lumbar region: Secondary | ICD-10-CM | POA: Diagnosis not present

## 2020-10-11 DIAGNOSIS — G894 Chronic pain syndrome: Secondary | ICD-10-CM | POA: Diagnosis not present

## 2020-10-11 DIAGNOSIS — M5136 Other intervertebral disc degeneration, lumbar region: Secondary | ICD-10-CM | POA: Diagnosis not present

## 2020-10-11 DIAGNOSIS — M62830 Muscle spasm of back: Secondary | ICD-10-CM | POA: Diagnosis not present

## 2020-10-22 ENCOUNTER — Encounter: Payer: Self-pay | Admitting: Internal Medicine

## 2020-10-24 ENCOUNTER — Other Ambulatory Visit: Payer: Self-pay

## 2020-10-24 ENCOUNTER — Encounter: Payer: Self-pay | Admitting: Internal Medicine

## 2020-10-24 ENCOUNTER — Ambulatory Visit (AMBULATORY_SURGERY_CENTER): Payer: BC Managed Care – PPO | Admitting: Internal Medicine

## 2020-10-24 VITALS — BP 121/71 | HR 67 | Temp 97.5°F | Resp 14 | Ht 60.75 in | Wt 127.0 lb

## 2020-10-24 DIAGNOSIS — D127 Benign neoplasm of rectosigmoid junction: Secondary | ICD-10-CM | POA: Diagnosis not present

## 2020-10-24 DIAGNOSIS — D128 Benign neoplasm of rectum: Secondary | ICD-10-CM | POA: Diagnosis not present

## 2020-10-24 DIAGNOSIS — D125 Benign neoplasm of sigmoid colon: Secondary | ICD-10-CM

## 2020-10-24 DIAGNOSIS — Z1211 Encounter for screening for malignant neoplasm of colon: Secondary | ICD-10-CM | POA: Diagnosis not present

## 2020-10-24 MED ORDER — SODIUM CHLORIDE 0.9 % IV SOLN: 500.0000 mL | Freq: Once | INTRAVENOUS | Status: DC

## 2020-10-24 NOTE — Patient Instructions (Addendum)
I found and removed 2 tiny polyps.  You also have a condition called diverticulosis - common and not usually a problem. Please read the handout provided.  A little red in rectum but no clear active proctitis.  I will let you know pathology results and when to have another routine colonoscopy by mail and/or My Chart.  I appreciate the opportunity to care for you. Gatha Mayer, MD, FACG  YOU HAD AN ENDOSCOPIC PROCEDURE TODAY AT [facility]:   Refer to the procedure report that was given to you for any specific questions about what was found during the examination.  If the procedure report does not answer your questions, please call your gastroenterologist to clarify.  If you requested that your care partner not be given the details of your procedure findings, then the procedure report has been included in a sealed envelope for you to review at your convenience later.  YOU SHOULD EXPECT: Some feelings of bloating in the abdomen. Passage of more gas than usual.  Walking can help get rid of the air that was put into your GI tract during the procedure and reduce the bloating. If you had a lower endoscopy (such as a colonoscopy or flexible sigmoidoscopy) you may notice spotting of blood in your stool or on the toilet paper. If you underwent a bowel prep for your procedure, you may not have a normal bowel movement for a few days.  Please Note:  You might notice some irritation and congestion in your nose or some drainage.  This is from the oxygen used during your procedure.  There is no need for concern and it should clear up in a day or so.  SYMPTOMS TO REPORT IMMEDIATELY:   Following lower endoscopy (colonoscopy or flexible sigmoidoscopy):  Excessive amounts of blood in the stool  Significant tenderness or worsening of abdominal pains  Swelling of the abdomen that is new, acute  Fever of 100F or higher   Following upper endoscopy (EGD)  Vomiting of blood or coffee ground  material  New chest pain or pain under the shoulder blades  Painful or persistently difficult swallowing  New shortness of breath  Fever of 100F or higher  Black, tarry-looking stools  For urgent or emergent issues, a gastroenterologist can be reached at any hour by calling [phone]. Do not use MyChart messaging for urgent concerns.    DIET:  We do recommend a small meal at first, but then you may proceed to your regular diet.  Drink plenty of fluids but you should avoid alcoholic beverages for 24 hours.  ACTIVITY:  You should plan to take it easy for the rest of today and you should NOT DRIVE or use heavy machinery until tomorrow (because of the sedation medicines used during the test).    FOLLOW UP: Our staff will call the number listed on your records 48-72 hours following your procedure to check on you and address any questions or concerns that you may have regarding the information given to you following your procedure. If we do not reach you, we will leave a message.  We will attempt to reach you two times.  During this call, we will ask if you have developed any symptoms of COVID 19. If you develop any symptoms (ie: fever, flu-like symptoms, shortness of breath, cough etc.) before then, please call [phone].  If you test positive for Covid 19 in the 2 weeks post procedure, please call and report this information to [place].  If any biopsies were taken you will be contacted by phone or by letter within the next 1-3 weeks.  Please call us at [phone] if you have not heard about the biopsies in 3 weeks.    SIGNATURES/CONFIDENTIALITY: You and/or your care partner have signed paperwork which will be entered into your electronic medical record.  These signatures attest to the fact that that the information above on your After Visit Summary has been reviewed and is understood.  Full responsibility of the confidentiality of this discharge information lies with you and/or your  care-partner.

## 2020-10-24 NOTE — Progress Notes (Signed)
[employer]

## 2020-10-24 NOTE — Progress Notes (Signed)
Called to room to assist during endoscopic procedure.  Patient ID and intended procedure confirmed with present staff. Received instructions for my participation in the procedure from the performing physician.  

## 2020-10-24 NOTE — Op Note (Signed)
[facility] Patient Name: Shawna Dean Procedure Date: 10/24/2020 9:29 AM MRN: [id number] Endoscopist: Gatha Mayer , MD Age: 60 Referring MD:  Date of Birth: Sep 10, 1960 Gender: Female Account #: [id number] Procedure:                Colonoscopy Indications:              Screening for colorectal malignant neoplasm Medicines:                Propofol per Anesthesia, Monitored Anesthesia Care Procedure:                Pre-Anesthesia Assessment:                           - Prior to the procedure, a History and Physical                            was performed, and patient medications and                            allergies were reviewed. The patient's tolerance of                            previous anesthesia was also reviewed. The risks                            and benefits of the procedure and the sedation                            options and risks were discussed with the patient.                            All questions were answered, and informed consent                            was obtained. Prior Anticoagulants: The patient has                            taken no previous anticoagulant or antiplatelet                            agents. ASA Grade Assessment: II - A patient with                            mild systemic disease. After reviewing the risks                            and benefits, the patient was deemed in                            satisfactory condition to undergo the procedure.                           After obtaining informed consent, the colonoscope  was passed under direct vision. Throughout the                            procedure, the patient's blood pressure, pulse, and                            oxygen saturations were monitored continuously. The                            Olympus PFC-[id number]) Colonoscope was                            introduced through the anus and advanced to the the                             terminal ileum, with identification of the                            appendiceal orifice and IC valve. The colonoscopy                            was performed without difficulty. The patient                            tolerated the procedure well. The quality of the                            bowel preparation was good. The terminal ileum,                            ileocecal valve, appendiceal orifice, and rectum                            were photographed. The bowel preparation used was                            Miralax via split dose instruction. Scope In: 9:48:57 AM Scope Out: 10:14:30 AM Scope Withdrawal Time: 0 hours 17 minutes 14 seconds  Total Procedure Duration: 0 hours 25 minutes 33 seconds  Findings:                 The perianal and digital rectal examinations were                            normal.                           Two sessile polyps were found in the rectum and                            sigmoid colon. The polyps were diminutive in size.                            These polyps were removed with a cold snare.  Resection and retrieval were complete. Verification                            of patient identification for the specimen was                            done. Estimated blood loss was minimal.                           Multiple small and large-mouthed diverticula were                            found in the sigmoid colon.                           A diffuse area of erythematous mucosa was found in                            the rectum.                           The exam was otherwise without abnormality on                            direct and retroflexion views. Complications:            No immediate complications. Estimated Blood Loss:     Estimated blood loss was minimal. Impression:               - Two diminutive polyps in the rectum and in the                            sigmoid colon, removed with a cold snare. Resected                             and retrieved.                           - Diverticulosis in the sigmoid colon.                           - Erythematous mucosa in the rectum. Quiescent                            proctitis vs prep effect                           - The examination was otherwise normal on direct                            and retroflexion views. Recommendation:           - Patient has a contact number available for                            emergencies. The signs and symptoms of potential  delayed complications were discussed with the                            patient. Return to normal activities tomorrow.                            Written discharge instructions were provided to the                            patient.                           - Resume previous diet.                           - Continue present medications.                           - Repeat colonoscopy is recommended. The                            colonoscopy date will be determined after pathology                            results from today's exam become available for                            review. Gatha Mayer, MD 10/24/2020 10:30:48 AM This report has been signed electronically.

## 2020-10-24 NOTE — Progress Notes (Signed)
Report to PACU, RN, vss, BBS= Clear.  

## 2020-10-26 ENCOUNTER — Telehealth: Payer: Self-pay | Admitting: *Deleted

## 2020-10-26 NOTE — Telephone Encounter (Signed)
  Follow up Call-  Call back number 10/24/2020  Post procedure Call Back phone  # [phone]  Permission to leave phone message Yes  Some recent data might be hidden     Patient questions:  Do you have a fever, pain , or abdominal swelling? No. Pain Score  0 *  Have you tolerated food without any problems? Yes.    Have you been able to return to your normal activities? Yes.    Do you have any questions about your discharge instructions: Diet   No. Medications  No. Follow up visit  No.  Do you have questions or concerns about your Care? No.  Actions: * If pain score is 4 or above: No action needed, pain <4.  1. Have you developed a fever since your procedure? no  2.   Have you had an respiratory symptoms (SOB or cough) since your procedure? no  3.   Have you tested positive for COVID 19 since your procedure no  4.   Have you had any family members/close contacts diagnosed with the COVID 19 since your procedure?  no   If yes to any of these questions please route to Joylene John, RN and Joella Prince, RN

## 2020-11-02 ENCOUNTER — Encounter: Payer: Self-pay | Admitting: Internal Medicine

## 2020-11-02 DIAGNOSIS — Z86010 Personal history of colonic polyps: Secondary | ICD-10-CM

## 2020-11-02 DIAGNOSIS — Z860101 Personal history of adenomatous and serrated colon polyps: Secondary | ICD-10-CM

## 2020-11-02 HISTORY — DX: Personal history of adenomatous and serrated colon polyps: Z86.0101

## 2020-11-02 HISTORY — DX: Personal history of colonic polyps: Z86.010

## 2020-11-08 DIAGNOSIS — M62830 Muscle spasm of back: Secondary | ICD-10-CM | POA: Diagnosis not present

## 2020-11-08 DIAGNOSIS — M5416 Radiculopathy, lumbar region: Secondary | ICD-10-CM | POA: Diagnosis not present

## 2020-11-08 DIAGNOSIS — M5136 Other intervertebral disc degeneration, lumbar region: Secondary | ICD-10-CM | POA: Diagnosis not present

## 2020-11-08 DIAGNOSIS — G894 Chronic pain syndrome: Secondary | ICD-10-CM | POA: Diagnosis not present

## 2020-12-06 DIAGNOSIS — M5416 Radiculopathy, lumbar region: Secondary | ICD-10-CM | POA: Diagnosis not present

## 2020-12-06 DIAGNOSIS — M5136 Other intervertebral disc degeneration, lumbar region: Secondary | ICD-10-CM | POA: Diagnosis not present

## 2020-12-06 DIAGNOSIS — G894 Chronic pain syndrome: Secondary | ICD-10-CM | POA: Diagnosis not present

## 2020-12-06 DIAGNOSIS — M62830 Muscle spasm of back: Secondary | ICD-10-CM | POA: Diagnosis not present

## 2021-01-03 DIAGNOSIS — M5136 Other intervertebral disc degeneration, lumbar region: Secondary | ICD-10-CM | POA: Diagnosis not present

## 2021-01-03 DIAGNOSIS — M5416 Radiculopathy, lumbar region: Secondary | ICD-10-CM | POA: Diagnosis not present

## 2021-01-03 DIAGNOSIS — M62830 Muscle spasm of back: Secondary | ICD-10-CM | POA: Diagnosis not present

## 2021-01-03 DIAGNOSIS — G894 Chronic pain syndrome: Secondary | ICD-10-CM | POA: Diagnosis not present

## 2021-01-17 DIAGNOSIS — M5416 Radiculopathy, lumbar region: Secondary | ICD-10-CM | POA: Diagnosis not present

## 2021-01-17 DIAGNOSIS — M5136 Other intervertebral disc degeneration, lumbar region: Secondary | ICD-10-CM | POA: Diagnosis not present

## 2021-01-17 DIAGNOSIS — M62830 Muscle spasm of back: Secondary | ICD-10-CM | POA: Diagnosis not present

## 2021-01-17 DIAGNOSIS — G894 Chronic pain syndrome: Secondary | ICD-10-CM | POA: Diagnosis not present

## 2021-02-28 DIAGNOSIS — M5136 Other intervertebral disc degeneration, lumbar region: Secondary | ICD-10-CM | POA: Diagnosis not present

## 2021-02-28 DIAGNOSIS — G894 Chronic pain syndrome: Secondary | ICD-10-CM | POA: Diagnosis not present

## 2021-02-28 DIAGNOSIS — M62830 Muscle spasm of back: Secondary | ICD-10-CM | POA: Diagnosis not present

## 2021-02-28 DIAGNOSIS — Z79891 Long term (current) use of opiate analgesic: Secondary | ICD-10-CM | POA: Diagnosis not present

## 2021-02-28 DIAGNOSIS — M5416 Radiculopathy, lumbar region: Secondary | ICD-10-CM | POA: Diagnosis not present

## 2021-03-28 DIAGNOSIS — M5416 Radiculopathy, lumbar region: Secondary | ICD-10-CM | POA: Diagnosis not present

## 2021-03-28 DIAGNOSIS — G894 Chronic pain syndrome: Secondary | ICD-10-CM | POA: Diagnosis not present

## 2021-03-28 DIAGNOSIS — M62830 Muscle spasm of back: Secondary | ICD-10-CM | POA: Diagnosis not present

## 2021-03-28 DIAGNOSIS — M5136 Other intervertebral disc degeneration, lumbar region: Secondary | ICD-10-CM | POA: Diagnosis not present

## 2021-04-04 DIAGNOSIS — Z23 Encounter for immunization: Secondary | ICD-10-CM | POA: Diagnosis not present

## 2021-04-04 DIAGNOSIS — I10 Essential (primary) hypertension: Secondary | ICD-10-CM | POA: Diagnosis not present

## 2021-04-04 DIAGNOSIS — M549 Dorsalgia, unspecified: Secondary | ICD-10-CM | POA: Diagnosis not present

## 2021-04-04 DIAGNOSIS — K5120 Ulcerative (chronic) proctitis without complications: Secondary | ICD-10-CM | POA: Diagnosis not present

## 2021-04-04 DIAGNOSIS — E785 Hyperlipidemia, unspecified: Secondary | ICD-10-CM | POA: Diagnosis not present

## 2021-04-08 DIAGNOSIS — Z1231 Encounter for screening mammogram for malignant neoplasm of breast: Secondary | ICD-10-CM | POA: Diagnosis not present

## 2021-04-10 ENCOUNTER — Encounter (HOSPITAL_BASED_OUTPATIENT_CLINIC_OR_DEPARTMENT_OTHER): Payer: Self-pay | Admitting: Obstetrics & Gynecology

## 2021-04-25 DIAGNOSIS — G894 Chronic pain syndrome: Secondary | ICD-10-CM | POA: Diagnosis not present

## 2021-04-25 DIAGNOSIS — M62830 Muscle spasm of back: Secondary | ICD-10-CM | POA: Diagnosis not present

## 2021-04-25 DIAGNOSIS — M5416 Radiculopathy, lumbar region: Secondary | ICD-10-CM | POA: Diagnosis not present

## 2021-04-25 DIAGNOSIS — M5136 Other intervertebral disc degeneration, lumbar region: Secondary | ICD-10-CM | POA: Diagnosis not present

## 2021-05-02 ENCOUNTER — Ambulatory Visit: Payer: BC Managed Care – PPO

## 2021-05-23 DIAGNOSIS — M62830 Muscle spasm of back: Secondary | ICD-10-CM | POA: Diagnosis not present

## 2021-05-23 DIAGNOSIS — G894 Chronic pain syndrome: Secondary | ICD-10-CM | POA: Diagnosis not present

## 2021-05-23 DIAGNOSIS — M5136 Other intervertebral disc degeneration, lumbar region: Secondary | ICD-10-CM | POA: Diagnosis not present

## 2021-05-23 DIAGNOSIS — M5416 Radiculopathy, lumbar region: Secondary | ICD-10-CM | POA: Diagnosis not present

## 2021-06-19 ENCOUNTER — Ambulatory Visit (INDEPENDENT_AMBULATORY_CARE_PROVIDER_SITE_OTHER): Payer: BC Managed Care – PPO | Admitting: Obstetrics & Gynecology

## 2021-06-19 ENCOUNTER — Encounter (HOSPITAL_BASED_OUTPATIENT_CLINIC_OR_DEPARTMENT_OTHER): Payer: Self-pay | Admitting: Obstetrics & Gynecology

## 2021-06-19 ENCOUNTER — Other Ambulatory Visit: Payer: Self-pay

## 2021-06-19 VITALS — BP 136/98 | HR 83 | Ht 60.5 in | Wt 135.2 lb

## 2021-06-19 DIAGNOSIS — Z780 Asymptomatic menopausal state: Secondary | ICD-10-CM

## 2021-06-19 DIAGNOSIS — Z01419 Encounter for gynecological examination (general) (routine) without abnormal findings: Secondary | ICD-10-CM | POA: Diagnosis not present

## 2021-06-19 DIAGNOSIS — Z23 Encounter for immunization: Secondary | ICD-10-CM | POA: Diagnosis not present

## 2021-06-19 DIAGNOSIS — D369 Benign neoplasm, unspecified site: Secondary | ICD-10-CM | POA: Diagnosis not present

## 2021-06-19 DIAGNOSIS — Z9882 Breast implant status: Secondary | ICD-10-CM

## 2021-06-19 DIAGNOSIS — M8580 Other specified disorders of bone density and structure, unspecified site: Secondary | ICD-10-CM | POA: Diagnosis not present

## 2021-06-19 NOTE — Progress Notes (Signed)
61 y.o. G0P0 Married White or Caucasian female here for annual exam.  Doing well.  Reports she's having an issue feeling like she had trouble swallowing at times.  Does not feel like she has reflux.  She did not discuss this with Dr. Carlean Purl.  Recommended she discuss this with him as well.  Having to have back surgery again.  She has been trying conservative measures.  Is followed now by Dr. Saintclair Halsted, [place].  Has seen Dr. Rita Ohara but he retired.    Denies vaginal bleeding.  Patient's last menstrual period was 03/02/2011.          Sexually active: Yes.    The current method of family planning is post menopausal status.    Exercising: Yes.     walking Smoker:  no  Health Maintenance: Pap:  03/13/2020 Negative, neg HR HPV History of abnormal Pap:  no MMG:  04/08/2021 Negative Colonoscopy:  10/24/2020, follow up 5 years BMD:   last done 2018 TDaP:  2013 Screening Labs: did blood work in August   reports that she has quit smoking. She has never used smokeless tobacco. She reports that she does not drink alcohol and does not use drugs.  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   PONV (postoperative nausea and vomiting)    Ulcerative proctitis (HCC)    suppository as needed for flare ups    Past Surgical History:  Procedure Laterality Date   ANTERIOR CRUCIATE LIGAMENT REPAIR Left    BACK SURGERY  10/01/2015, 12/18   BREAST ENHANCEMENT SURGERY     replaced a second time 9/11   BREAST REDUCTION SURGERY   03/03/2018   BUNIONECTOMY Left    COLONOSCOPY     COLONOSCOPY W/ BIOPSIES  08/17/2009   POLYPECTOMY     Spinal Cyst Aspiration     back and right side of face    Current Outpatient Medications  Medication Sig Dispense Refill   amlodipine-benazepril (LOTREL) 2.5-10 MG per capsule Take 1 capsule by mouth daily.     augmented betamethasone dipropionate (DIPROLENE-AF) 0.05 % cream APPLY TO AFFECTED AREA 2 TO 3 TIMES A DAY WHEN FLARED FOR 2 TO 3 WEEKS (Patient not taking: No sig reported)  0   Calcium-Magnesium-Vitamin D (CALCIUM 500 PO) Take 1 tablet by mouth daily.  (Patient not taking: No sig reported)     CONCERTA 54 MG CR tablet Take 54 mg by mouth every morning.  0   diclofenac sodium (VOLTAREN) 1 % GEL Apply 2 g topically 3 (three) times daily as needed (for pain).      DULoxetine (CYMBALTA) 60 MG capsule Take 60 mg by mouth daily.     EPINEPHrine (EPIPEN JR) 0.15 MG/0.3ML injection Inject 0.15 mg into the muscle as needed for anaphylaxis.      mesalamine (CANASA) 1000 MG suppository Place 1 suppository (1,000 mg total) rectally daily as needed. For flare ups 30  suppository 3   Misc Natural Products (AIRBORNE ELDERBERRY) CHEW Chew by mouth. 2 gummies daily     Multiple Vitamin (MULTIVITAMIN) tablet Take 1 tablet by mouth daily.     PERCOCET 10-325 MG tablet Take 1 tablet by mouth every 4 (four) hours as needed.     rosuvastatin (CRESTOR) 5 MG tablet 1 tablet     traZODone (DESYREL) 50 MG tablet TAKE 1, 2, OR 3 TABLETS AT BEDTIME FOR SLEEP DISTURBANCE  3   VITAMIN D, CHOLECALCIFEROL, PO Take 4,000 Units by mouth daily.     zinc gluconate 50 MG tablet Take 50 mg by mouth daily.     Current Facility-Administered Medications  Medication Dose Route Frequency Provider Last Rate Last Admin   0.9 %  sodium chloride infusion  500 mL Intravenous Once Gatha Mayer, MD        Family History  Problem Relation Age of Onset   Lymphoma Mother 46   Parkinson's disease Father    Hypertension  Father    Colon cancer Neg Hx    Colon polyps Neg Hx    Esophageal cancer Neg Hx    Rectal cancer Neg Hx    Stomach cancer Neg Hx     Review of Systems  All other systems reviewed and are negative.  Exam:   BP (!) 136/98 (BP Location: Right Arm, Patient Position: Sitting, Cuff Size: Small)   Pulse 83   Ht 5' 0.5" (1.537 m)   Wt 135 lb 3.2 oz (61.3 kg)   LMP 03/02/2011   BMI 25.97 kg/m   Height: 5' 0.5" (153.7 cm)  General appearance: alert, cooperative and appears stated age Head: Normocephalic, without obvious abnormality, atraumatic Neck: no adenopathy, supple, symmetrical, trachea midline and thyroid normal to inspection and palpation Lungs: clear to auscultation bilaterally Breasts: normal appearance, no masses or tenderness Heart: regular rate and rhythm Abdomen: soft, non-tender; bowel sounds normal; no masses,  no organomegaly Extremities: extremities normal, atraumatic, no cyanosis or edema Skin: Skin color, texture, turgor normal. No rashes or lesions Lymph nodes: Cervical, supraclavicular, and axillary nodes normal. No abnormal inguinal nodes palpated Neurologic: Grossly normal   Pelvic: External genitalia:  no lesions              Urethra:  normal appearing urethra with no masses, tenderness or lesions              Bartholins and Skenes: normal                 Vagina: normal appearing vagina with normal color and no discharge, no lesions              Cervix: no lesions              Pap taken: No. Bimanual Exam:  Uterus:  normal size, contour, position, consistency, mobility, non-tender              Adnexa: normal adnexa and no mass, fullness, tenderness               Rectovaginal: Confirms               Anus:  normal sphincter tone, no lesions  Chaperone, Octaviano Batty, CMA, was present for exam.  Assessment/Plan: 1. Well woman exam with routine gynecological exam - pap neg with neg HR HPV 2021 - MMG up to date - colonoscopy done 2022 - consider BMD next  year.  Declines for now. - care gaps updated/reviewed - lab work  done with Dr. Marisue Humble in August  2. Postmenopausal - no HRT  3. Adenomatous polyp - follow up 5 years for colonoscopy  4. Osteopenia, unspecified location  5. History of breast implant - has bilateral implants now

## 2021-06-20 DIAGNOSIS — G894 Chronic pain syndrome: Secondary | ICD-10-CM | POA: Diagnosis not present

## 2021-06-20 DIAGNOSIS — M5416 Radiculopathy, lumbar region: Secondary | ICD-10-CM | POA: Diagnosis not present

## 2021-06-20 DIAGNOSIS — M5136 Other intervertebral disc degeneration, lumbar region: Secondary | ICD-10-CM | POA: Diagnosis not present

## 2021-06-20 DIAGNOSIS — M62830 Muscle spasm of back: Secondary | ICD-10-CM | POA: Diagnosis not present

## 2021-07-04 DIAGNOSIS — M4316 Spondylolisthesis, lumbar region: Secondary | ICD-10-CM | POA: Diagnosis not present

## 2021-07-05 ENCOUNTER — Other Ambulatory Visit: Payer: Self-pay | Admitting: Neurosurgery

## 2021-07-05 DIAGNOSIS — M4316 Spondylolisthesis, lumbar region: Secondary | ICD-10-CM

## 2021-07-10 DIAGNOSIS — Z23 Encounter for immunization: Secondary | ICD-10-CM | POA: Diagnosis not present

## 2021-07-18 DIAGNOSIS — M62830 Muscle spasm of back: Secondary | ICD-10-CM | POA: Diagnosis not present

## 2021-07-18 DIAGNOSIS — M5416 Radiculopathy, lumbar region: Secondary | ICD-10-CM | POA: Diagnosis not present

## 2021-07-18 DIAGNOSIS — M5136 Other intervertebral disc degeneration, lumbar region: Secondary | ICD-10-CM | POA: Diagnosis not present

## 2021-07-18 DIAGNOSIS — G894 Chronic pain syndrome: Secondary | ICD-10-CM | POA: Diagnosis not present

## 2021-08-07 ENCOUNTER — Ambulatory Visit
Admission: RE | Admit: 2021-08-07 | Discharge: 2021-08-07 | Disposition: A | Discharge status: Home / Self Care | Payer: BC Managed Care – PPO | Source: Ambulatory Visit | Attending: Neurosurgery | Admitting: Neurosurgery

## 2021-08-07 DIAGNOSIS — M48061 Spinal stenosis, lumbar region without neurogenic claudication: Secondary | ICD-10-CM | POA: Diagnosis not present

## 2021-08-07 DIAGNOSIS — M4316 Spondylolisthesis, lumbar region: Secondary | ICD-10-CM

## 2021-08-07 DIAGNOSIS — M545 Low back pain, unspecified: Secondary | ICD-10-CM | POA: Diagnosis not present

## 2021-08-15 DIAGNOSIS — M5136 Other intervertebral disc degeneration, lumbar region: Secondary | ICD-10-CM | POA: Diagnosis not present

## 2021-08-15 DIAGNOSIS — G894 Chronic pain syndrome: Secondary | ICD-10-CM | POA: Diagnosis not present

## 2021-08-15 DIAGNOSIS — M5416 Radiculopathy, lumbar region: Secondary | ICD-10-CM | POA: Diagnosis not present

## 2021-08-15 DIAGNOSIS — M62830 Muscle spasm of back: Secondary | ICD-10-CM | POA: Diagnosis not present

## 2021-09-05 DIAGNOSIS — M4316 Spondylolisthesis, lumbar region: Secondary | ICD-10-CM | POA: Diagnosis not present

## 2021-09-12 DIAGNOSIS — G894 Chronic pain syndrome: Secondary | ICD-10-CM | POA: Diagnosis not present

## 2021-09-12 DIAGNOSIS — M62830 Muscle spasm of back: Secondary | ICD-10-CM | POA: Diagnosis not present

## 2021-09-12 DIAGNOSIS — M5136 Other intervertebral disc degeneration, lumbar region: Secondary | ICD-10-CM | POA: Diagnosis not present

## 2021-09-12 DIAGNOSIS — M5416 Radiculopathy, lumbar region: Secondary | ICD-10-CM | POA: Diagnosis not present

## 2021-10-10 DIAGNOSIS — Z79891 Long term (current) use of opiate analgesic: Secondary | ICD-10-CM | POA: Diagnosis not present

## 2021-10-10 DIAGNOSIS — G894 Chronic pain syndrome: Secondary | ICD-10-CM | POA: Diagnosis not present

## 2021-10-10 DIAGNOSIS — M5136 Other intervertebral disc degeneration, lumbar region: Secondary | ICD-10-CM | POA: Diagnosis not present

## 2021-10-10 DIAGNOSIS — M62830 Muscle spasm of back: Secondary | ICD-10-CM | POA: Diagnosis not present

## 2021-10-10 DIAGNOSIS — M5416 Radiculopathy, lumbar region: Secondary | ICD-10-CM | POA: Diagnosis not present

## 2021-11-07 DIAGNOSIS — M5136 Other intervertebral disc degeneration, lumbar region: Secondary | ICD-10-CM | POA: Diagnosis not present

## 2021-11-07 DIAGNOSIS — G894 Chronic pain syndrome: Secondary | ICD-10-CM | POA: Diagnosis not present

## 2021-11-07 DIAGNOSIS — M62830 Muscle spasm of back: Secondary | ICD-10-CM | POA: Diagnosis not present

## 2021-11-07 DIAGNOSIS — M5416 Radiculopathy, lumbar region: Secondary | ICD-10-CM | POA: Diagnosis not present

## 2021-12-05 DIAGNOSIS — M5136 Other intervertebral disc degeneration, lumbar region: Secondary | ICD-10-CM | POA: Diagnosis not present

## 2021-12-05 DIAGNOSIS — M62830 Muscle spasm of back: Secondary | ICD-10-CM | POA: Diagnosis not present

## 2021-12-05 DIAGNOSIS — M5416 Radiculopathy, lumbar region: Secondary | ICD-10-CM | POA: Diagnosis not present

## 2021-12-05 DIAGNOSIS — G894 Chronic pain syndrome: Secondary | ICD-10-CM | POA: Diagnosis not present

## 2022-01-02 DIAGNOSIS — M5136 Other intervertebral disc degeneration, lumbar region: Secondary | ICD-10-CM | POA: Diagnosis not present

## 2022-01-02 DIAGNOSIS — M62830 Muscle spasm of back: Secondary | ICD-10-CM | POA: Diagnosis not present

## 2022-01-02 DIAGNOSIS — M5416 Radiculopathy, lumbar region: Secondary | ICD-10-CM | POA: Diagnosis not present

## 2022-01-02 DIAGNOSIS — G894 Chronic pain syndrome: Secondary | ICD-10-CM | POA: Diagnosis not present

## 2022-01-30 DIAGNOSIS — M5136 Other intervertebral disc degeneration, lumbar region: Secondary | ICD-10-CM | POA: Diagnosis not present

## 2022-01-30 DIAGNOSIS — M62830 Muscle spasm of back: Secondary | ICD-10-CM | POA: Diagnosis not present

## 2022-01-30 DIAGNOSIS — M5416 Radiculopathy, lumbar region: Secondary | ICD-10-CM | POA: Diagnosis not present

## 2022-01-30 DIAGNOSIS — G894 Chronic pain syndrome: Secondary | ICD-10-CM | POA: Diagnosis not present

## 2022-02-27 DIAGNOSIS — M5136 Other intervertebral disc degeneration, lumbar region: Secondary | ICD-10-CM | POA: Diagnosis not present

## 2022-02-27 DIAGNOSIS — M62830 Muscle spasm of back: Secondary | ICD-10-CM | POA: Diagnosis not present

## 2022-02-27 DIAGNOSIS — M5416 Radiculopathy, lumbar region: Secondary | ICD-10-CM | POA: Diagnosis not present

## 2022-02-27 DIAGNOSIS — G894 Chronic pain syndrome: Secondary | ICD-10-CM | POA: Diagnosis not present

## 2022-03-27 DIAGNOSIS — G894 Chronic pain syndrome: Secondary | ICD-10-CM | POA: Diagnosis not present

## 2022-03-27 DIAGNOSIS — M5136 Other intervertebral disc degeneration, lumbar region: Secondary | ICD-10-CM | POA: Diagnosis not present

## 2022-03-27 DIAGNOSIS — M5416 Radiculopathy, lumbar region: Secondary | ICD-10-CM | POA: Diagnosis not present

## 2022-03-27 DIAGNOSIS — M62830 Muscle spasm of back: Secondary | ICD-10-CM | POA: Diagnosis not present

## 2022-04-14 ENCOUNTER — Other Ambulatory Visit: Payer: Self-pay | Admitting: Family Medicine

## 2022-04-14 DIAGNOSIS — R1310 Dysphagia, unspecified: Secondary | ICD-10-CM

## 2022-04-14 DIAGNOSIS — Z1231 Encounter for screening mammogram for malignant neoplasm of breast: Secondary | ICD-10-CM | POA: Diagnosis not present

## 2022-04-14 DIAGNOSIS — I10 Essential (primary) hypertension: Secondary | ICD-10-CM | POA: Diagnosis not present

## 2022-04-14 DIAGNOSIS — E785 Hyperlipidemia, unspecified: Secondary | ICD-10-CM | POA: Diagnosis not present

## 2022-04-14 DIAGNOSIS — Z0000 Encounter for general adult medical examination without abnormal findings: Secondary | ICD-10-CM | POA: Diagnosis not present

## 2022-04-14 DIAGNOSIS — Z23 Encounter for immunization: Secondary | ICD-10-CM | POA: Diagnosis not present

## 2022-04-17 ENCOUNTER — Ambulatory Visit
Admission: RE | Admit: 2022-04-17 | Discharge: 2022-04-17 | Disposition: A | Discharge status: Home / Self Care | Payer: BC Managed Care – PPO | Source: Ambulatory Visit | Attending: Family Medicine | Admitting: Family Medicine

## 2022-04-17 DIAGNOSIS — R1310 Dysphagia, unspecified: Secondary | ICD-10-CM

## 2022-04-24 ENCOUNTER — Encounter (HOSPITAL_BASED_OUTPATIENT_CLINIC_OR_DEPARTMENT_OTHER): Payer: Self-pay | Admitting: *Deleted

## 2022-04-24 DIAGNOSIS — G894 Chronic pain syndrome: Secondary | ICD-10-CM | POA: Diagnosis not present

## 2022-04-24 DIAGNOSIS — M5136 Other intervertebral disc degeneration, lumbar region: Secondary | ICD-10-CM | POA: Diagnosis not present

## 2022-04-24 DIAGNOSIS — M62830 Muscle spasm of back: Secondary | ICD-10-CM | POA: Diagnosis not present

## 2022-04-24 DIAGNOSIS — M5416 Radiculopathy, lumbar region: Secondary | ICD-10-CM | POA: Diagnosis not present

## 2022-05-15 DIAGNOSIS — M5136 Other intervertebral disc degeneration, lumbar region: Secondary | ICD-10-CM | POA: Diagnosis not present

## 2022-05-15 DIAGNOSIS — M5416 Radiculopathy, lumbar region: Secondary | ICD-10-CM | POA: Diagnosis not present

## 2022-05-15 DIAGNOSIS — M62830 Muscle spasm of back: Secondary | ICD-10-CM | POA: Diagnosis not present

## 2022-05-15 DIAGNOSIS — G894 Chronic pain syndrome: Secondary | ICD-10-CM | POA: Diagnosis not present

## 2022-05-23 DIAGNOSIS — M19071 Primary osteoarthritis, right ankle and foot: Secondary | ICD-10-CM | POA: Diagnosis not present

## 2022-05-23 DIAGNOSIS — M79671 Pain in right foot: Secondary | ICD-10-CM | POA: Diagnosis not present

## 2022-06-23 DIAGNOSIS — M5416 Radiculopathy, lumbar region: Secondary | ICD-10-CM | POA: Diagnosis not present

## 2022-06-23 DIAGNOSIS — M5136 Other intervertebral disc degeneration, lumbar region: Secondary | ICD-10-CM | POA: Diagnosis not present

## 2022-06-23 DIAGNOSIS — G894 Chronic pain syndrome: Secondary | ICD-10-CM | POA: Diagnosis not present

## 2022-06-23 DIAGNOSIS — M62830 Muscle spasm of back: Secondary | ICD-10-CM | POA: Diagnosis not present

## 2022-07-17 DIAGNOSIS — M5136 Other intervertebral disc degeneration, lumbar region: Secondary | ICD-10-CM | POA: Diagnosis not present

## 2022-07-17 DIAGNOSIS — M62830 Muscle spasm of back: Secondary | ICD-10-CM | POA: Diagnosis not present

## 2022-07-17 DIAGNOSIS — M5416 Radiculopathy, lumbar region: Secondary | ICD-10-CM | POA: Diagnosis not present

## 2022-07-17 DIAGNOSIS — G894 Chronic pain syndrome: Secondary | ICD-10-CM | POA: Diagnosis not present

## 2022-09-11 DIAGNOSIS — M5136 Other intervertebral disc degeneration, lumbar region: Secondary | ICD-10-CM | POA: Diagnosis not present

## 2022-09-11 DIAGNOSIS — M62830 Muscle spasm of back: Secondary | ICD-10-CM | POA: Diagnosis not present

## 2022-09-11 DIAGNOSIS — M5416 Radiculopathy, lumbar region: Secondary | ICD-10-CM | POA: Diagnosis not present

## 2022-09-11 DIAGNOSIS — G894 Chronic pain syndrome: Secondary | ICD-10-CM | POA: Diagnosis not present

## 2022-10-09 DIAGNOSIS — M62830 Muscle spasm of back: Secondary | ICD-10-CM | POA: Diagnosis not present

## 2022-10-09 DIAGNOSIS — M5136 Other intervertebral disc degeneration, lumbar region: Secondary | ICD-10-CM | POA: Diagnosis not present

## 2022-10-09 DIAGNOSIS — M5416 Radiculopathy, lumbar region: Secondary | ICD-10-CM | POA: Diagnosis not present

## 2022-10-09 DIAGNOSIS — G894 Chronic pain syndrome: Secondary | ICD-10-CM | POA: Diagnosis not present

## 2022-11-06 DIAGNOSIS — M62830 Muscle spasm of back: Secondary | ICD-10-CM | POA: Diagnosis not present

## 2022-11-06 DIAGNOSIS — M5136 Other intervertebral disc degeneration, lumbar region: Secondary | ICD-10-CM | POA: Diagnosis not present

## 2022-11-06 DIAGNOSIS — M5416 Radiculopathy, lumbar region: Secondary | ICD-10-CM | POA: Diagnosis not present

## 2022-11-06 DIAGNOSIS — G894 Chronic pain syndrome: Secondary | ICD-10-CM | POA: Diagnosis not present

## 2022-12-04 DIAGNOSIS — M62830 Muscle spasm of back: Secondary | ICD-10-CM | POA: Diagnosis not present

## 2022-12-04 DIAGNOSIS — G894 Chronic pain syndrome: Secondary | ICD-10-CM | POA: Diagnosis not present

## 2022-12-04 DIAGNOSIS — M5416 Radiculopathy, lumbar region: Secondary | ICD-10-CM | POA: Diagnosis not present

## 2022-12-04 DIAGNOSIS — M5136 Other intervertebral disc degeneration, lumbar region: Secondary | ICD-10-CM | POA: Diagnosis not present

## 2023-01-01 DIAGNOSIS — M5136 Other intervertebral disc degeneration, lumbar region: Secondary | ICD-10-CM | POA: Diagnosis not present

## 2023-01-01 DIAGNOSIS — M5416 Radiculopathy, lumbar region: Secondary | ICD-10-CM | POA: Diagnosis not present

## 2023-01-01 DIAGNOSIS — G894 Chronic pain syndrome: Secondary | ICD-10-CM | POA: Diagnosis not present

## 2023-01-01 DIAGNOSIS — M62830 Muscle spasm of back: Secondary | ICD-10-CM | POA: Diagnosis not present

## 2023-01-29 DIAGNOSIS — M62830 Muscle spasm of back: Secondary | ICD-10-CM | POA: Diagnosis not present

## 2023-01-29 DIAGNOSIS — M5136 Other intervertebral disc degeneration, lumbar region: Secondary | ICD-10-CM | POA: Diagnosis not present

## 2023-01-29 DIAGNOSIS — M5416 Radiculopathy, lumbar region: Secondary | ICD-10-CM | POA: Diagnosis not present

## 2023-01-29 DIAGNOSIS — G894 Chronic pain syndrome: Secondary | ICD-10-CM | POA: Diagnosis not present

## 2023-02-26 DIAGNOSIS — M62830 Muscle spasm of back: Secondary | ICD-10-CM | POA: Diagnosis not present

## 2023-02-26 DIAGNOSIS — G894 Chronic pain syndrome: Secondary | ICD-10-CM | POA: Diagnosis not present

## 2023-02-26 DIAGNOSIS — M5416 Radiculopathy, lumbar region: Secondary | ICD-10-CM | POA: Diagnosis not present

## 2023-02-26 DIAGNOSIS — M5136 Other intervertebral disc degeneration, lumbar region: Secondary | ICD-10-CM | POA: Diagnosis not present

## 2023-03-26 DIAGNOSIS — M5416 Radiculopathy, lumbar region: Secondary | ICD-10-CM | POA: Diagnosis not present

## 2023-03-26 DIAGNOSIS — M62830 Muscle spasm of back: Secondary | ICD-10-CM | POA: Diagnosis not present

## 2023-03-26 DIAGNOSIS — M5136 Other intervertebral disc degeneration, lumbar region: Secondary | ICD-10-CM | POA: Diagnosis not present

## 2023-03-26 DIAGNOSIS — G894 Chronic pain syndrome: Secondary | ICD-10-CM | POA: Diagnosis not present

## 2023-04-20 DIAGNOSIS — Z1231 Encounter for screening mammogram for malignant neoplasm of breast: Secondary | ICD-10-CM | POA: Diagnosis not present

## 2023-04-23 ENCOUNTER — Encounter (HOSPITAL_BASED_OUTPATIENT_CLINIC_OR_DEPARTMENT_OTHER): Payer: Self-pay | Admitting: Obstetrics & Gynecology

## 2023-04-23 ENCOUNTER — Other Ambulatory Visit (HOSPITAL_COMMUNITY)
Admission: RE | Admit: 2023-04-23 | Discharge: 2023-04-23 | Disposition: A | Discharge status: Home / Self Care | Payer: BC Managed Care – PPO | Source: Ambulatory Visit | Attending: Obstetrics & Gynecology | Admitting: Obstetrics & Gynecology

## 2023-04-23 ENCOUNTER — Ambulatory Visit (INDEPENDENT_AMBULATORY_CARE_PROVIDER_SITE_OTHER): Payer: BC Managed Care – PPO | Admitting: Obstetrics & Gynecology

## 2023-04-23 VITALS — BP 138/94 | HR 70 | Ht 60.5 in | Wt 135.0 lb

## 2023-04-23 DIAGNOSIS — Z01419 Encounter for gynecological examination (general) (routine) without abnormal findings: Secondary | ICD-10-CM | POA: Diagnosis not present

## 2023-04-23 DIAGNOSIS — Z124 Encounter for screening for malignant neoplasm of cervix: Secondary | ICD-10-CM | POA: Insufficient documentation

## 2023-04-23 DIAGNOSIS — G894 Chronic pain syndrome: Secondary | ICD-10-CM | POA: Diagnosis not present

## 2023-04-23 DIAGNOSIS — Z780 Asymptomatic menopausal state: Secondary | ICD-10-CM

## 2023-04-23 DIAGNOSIS — M5416 Radiculopathy, lumbar region: Secondary | ICD-10-CM | POA: Diagnosis not present

## 2023-04-23 DIAGNOSIS — M5136 Other intervertebral disc degeneration, lumbar region: Secondary | ICD-10-CM | POA: Diagnosis not present

## 2023-04-23 DIAGNOSIS — Z9882 Breast implant status: Secondary | ICD-10-CM

## 2023-04-23 DIAGNOSIS — M62830 Muscle spasm of back: Secondary | ICD-10-CM | POA: Diagnosis not present

## 2023-04-23 NOTE — Progress Notes (Signed)
62 y.o. G0P0 Married White or Caucasian female here for annual exam.  Doing well.  Denies vaginal bleeding.    Dr. Manus Gunning is retiring.  PCP options discussed.    Has decided not to have any more back surgery.  Is trying to walk at least 2 miles every day.  Concerned about what the future holds for her but trying to be as active as she can.  Says her husband is so helpful and supportive.  Patient's last menstrual period was 03/02/2011.          Sexually active: Yes.    The current method of family planning is post menopausal status.    Exercising: Yes.     Walking, hiking Smoker:  no  Health Maintenance: Pap:  03/2020 History of abnormal Pap:  no MMG:  04/24/2022 Colonoscopy:  10/24/2020 BMD:   2018 Screening Labs: does with Dr. Manus Gunning, has appt next week   reports that she has quit smoking. She has never used smokeless tobacco. She reports that she does not drink alcohol and does not use drugs.  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   PONV (postoperative nausea and vomiting)    Ulcerative proctitis (HCC)    suppository as needed for flare ups    Past Surgical History:  Procedure Laterality Date   ANTERIOR CRUCIATE LIGAMENT REPAIR Left    BACK SURGERY  10/01/2015, 12/18   BREAST ENHANCEMENT SURGERY     replaced a second time 9/11   BREAST REDUCTION SURGERY  03/03/2018   with bilateral implant removal and replacement   BUNIONECTOMY Left    COLONOSCOPY     COLONOSCOPY W/ BIOPSIES   08/17/2009   POLYPECTOMY     Spinal Cyst Aspiration     back and right side of face    Current Outpatient Medications  Medication Sig Dispense Refill   amlodipine-benazepril (LOTREL) 2.5-10 MG per capsule Take 1 capsule by mouth daily.     CONCERTA 54 MG CR tablet Take 54 mg by mouth every morning.  0   diclofenac sodium (VOLTAREN) 1 % GEL Apply 2 g topically 3 (three) times daily as needed (for pain).      DULoxetine (CYMBALTA) 60 MG capsule Take 60 mg by mouth daily.     EPINEPHrine (EPIPEN JR) 0.15 MG/0.3ML injection Inject 0.15 mg into the muscle as needed for anaphylaxis.      mesalamine (CANASA) 1000 MG suppository Place 1 suppository (1,000 mg total) rectally daily as needed. For flare ups 30 suppository 3   Misc Natural Products (AIRBORNE ELDERBERRY) CHEW Chew by mouth. 2 gummies daily     Multiple Vitamin (MULTIVITAMIN) tablet Take 1 tablet by mouth daily.     PERCOCET 10-325 MG tablet Take 1 tablet by mouth every 4 (four) hours as needed.     rosuvastatin (CRESTOR) 5 MG tablet 1 tablet     traZODone (DESYREL) 50 MG tablet  TAKE 1, 2, OR 3 TABLETS AT BEDTIME FOR SLEEP DISTURBANCE  3   VITAMIN D, CHOLECALCIFEROL, PO Take 4,000 Units by mouth daily.     zinc gluconate 50 MG tablet Take 50 mg by mouth daily.     augmented betamethasone dipropionate (DIPROLENE-AF) 0.05 % cream APPLY TO AFFECTED AREA 2 TO 3 TIMES A DAY WHEN FLARED FOR 2 TO 3 WEEKS (Patient not taking: Reported on 10/08/2020)  0   Calcium-Magnesium-Vitamin D (CALCIUM 500 PO) Take 1 tablet by mouth daily.  (Patient not taking: Reported on 10/08/2020)     Current Facility-Administered Medications  Medication Dose Route Frequency Provider Last Rate Last Admin   0.9 %  sodium chloride infusion  500 mL Intravenous Once Iva Boop, MD        Family History  Problem Relation Age of Onset   Lymphoma Mother 31   Parkinson's disease Father    Hypertension Father    Colon cancer Neg Hx    Colon polyps Neg Hx    Esophageal  cancer Neg Hx    Rectal cancer Neg Hx    Stomach cancer Neg Hx     ROS: Constitutional: negative Genitourinary:negative  Exam:   BP (!) 138/94 (BP Location: Left Arm, Patient Position: Sitting, Cuff Size: Normal)   Pulse 70   Ht 5' 0.5" (1.537 m)   Wt 135 lb (61.2 kg)   LMP 03/02/2011   BMI 25.93 kg/m   Height: 5' 0.5" (153.7 cm)  General appearance: alert, cooperative and appears stated age Head: Normocephalic, without obvious abnormality, atraumatic Neck: no adenopathy, supple, symmetrical, trachea midline and thyroid normal to inspection and palpation Lungs: clear to auscultation bilaterally Breasts: normal appearance, no masses or tenderness, bilateral implants present Heart: regular rate and rhythm Abdomen: soft, non-tender; bowel sounds normal; no masses,  no organomegaly Extremities: extremities normal, atraumatic, no cyanosis or edema Skin: Skin color, texture, turgor normal. No rashes or lesions Lymph nodes: Cervical, supraclavicular, and axillary nodes normal. No abnormal inguinal nodes palpated Neurologic: Grossly normal   Pelvic: External genitalia:  no lesions              Urethra:  normal appearing urethra with no masses, tenderness or lesions              Bartholins and Skenes: normal                 Vagina: normal appearing vagina with normal color and no discharge, no lesions              Cervix: no lesions              Pap taken: Yes.   Bimanual Exam:  Uterus:  normal size, contour, position, consistency, mobility, non-tender              Adnexa: no mass, fullness, tenderness               Rectovaginal: Confirms               Anus:  normal sphincter tone, no lesions  Chaperone, Ina Homes, CMA, was present for exam.  Assessment/Plan: 1. Well woman exam with routine gynecological exam - Pap smear and HR HPV obtained today - Mammogram 04/24/2022 - Colonoscopy 10/24/2020.  Follow up 5 years. - Bone mineral density 2018.  Discussed.  She will scheduled  with next mammogram - lab work done with PCP, Dr. Manus Gunning - vaccines reviewed/updated  2. Postmenopausal - not on HRT  3.  Cervical cancer screening - Cytology - PAP( [place])  4. History of breast implant

## 2023-04-23 NOTE — Progress Notes (Deleted)
62 y.o. G0P0 Married White or Caucasian female here for annual exam.    Patient's last menstrual period was 03/02/2011.          Sexually active: {yes no:314532}  The current method of family planning is {contraception:315051}.     The pregnancy intention screening data noted above was reviewed. Potential methods of contraception were discussed. The patient elected to proceed with No data recorded.  Exercising: {yes no:314532}  {types:19826} Smoker:  {YES [id number]  Health Maintenance: Pap:  03/13/2020 History of abnormal Pap:  {YES NO:22349} MMG:  04/14/2022 Colonoscopy:  10/24/2020 BMD:    Screening Labs:    reports that she has quit smoking. She has never used smokeless tobacco. She reports that she does not drink alcohol and does not use drugs.  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   PONV (postoperative nausea and vomiting)    Ulcerative proctitis (HCC)    suppository as needed for flare ups    Past Surgical History:  Procedure Laterality Date   ANTERIOR CRUCIATE LIGAMENT REPAIR Left    BACK SURGERY  10/01/2015, 12/18   BREAST ENHANCEMENT SURGERY     replaced a second time 9/11   BREAST REDUCTION SURGERY  03/03/2018   with bilateral implant removal and replacement   BUNIONECTOMY Left    COLONOSCOPY     COLONOSCOPY W/ BIOPSIES  08/17/2009   POLYPECTOMY     Spinal Cyst Aspiration     back and right side of face    Current Outpatient Medications  Medication Sig Dispense Refill    amlodipine-benazepril (LOTREL) 2.5-10 MG per capsule Take 1 capsule by mouth daily.     augmented betamethasone dipropionate (DIPROLENE-AF) 0.05 % cream APPLY TO AFFECTED AREA 2 TO 3 TIMES A DAY WHEN FLARED FOR 2 TO 3 WEEKS (Patient not taking: No sig reported)  0   Calcium-Magnesium-Vitamin D (CALCIUM 500 PO) Take 1 tablet by mouth daily.  (Patient not taking: No sig reported)     CONCERTA 54 MG CR tablet Take 54 mg by mouth every morning.  0   diclofenac sodium (VOLTAREN) 1 % GEL Apply 2 g topically 3 (three) times daily as needed (for pain).      DULoxetine (CYMBALTA) 60 MG capsule Take 60 mg by mouth daily.     EPINEPHrine (EPIPEN JR) 0.15 MG/0.3ML injection Inject 0.15 mg into the muscle as needed for anaphylaxis.      mesalamine (CANASA) 1000 MG suppository Place 1 suppository (1,000 mg total) rectally daily as needed. For flare ups 30 suppository 3   Misc Natural Products (AIRBORNE ELDERBERRY) CHEW Chew by mouth. 2 gummies daily     Multiple Vitamin (MULTIVITAMIN) tablet Take 1 tablet by mouth daily.     PERCOCET 10-325 MG tablet Take 1 tablet by mouth every 4 (four) hours as needed.     rosuvastatin (CRESTOR) 5 MG tablet 1 tablet  traZODone (DESYREL) 50 MG tablet TAKE 1, 2, OR 3 TABLETS AT BEDTIME FOR SLEEP DISTURBANCE  3   VITAMIN D, CHOLECALCIFEROL, PO Take 4,000 Units by mouth daily.     zinc gluconate 50 MG tablet Take 50 mg by mouth daily.     Current Facility-Administered Medications  Medication Dose Route Frequency Provider Last Rate Last Admin   0.9 %  sodium chloride infusion  500 mL Intravenous Once Iva Boop, MD        Family History  Problem Relation Age of Onset   Lymphoma Mother 38   Parkinson's disease Father    Hypertension Father    Colon cancer Neg Hx    Colon polyps Neg Hx    Esophageal cancer Neg Hx    Rectal cancer Neg Hx    Stomach cancer Neg Hx     ROS: Constitutional: {Findings; ROS  constitutional:30497::"negative"} Genitourinary:{Findings; ROS genitourinary:19593::"negative"}  Exam:   LMP 03/02/2011      General appearance: alert, cooperative and appears stated age Head: Normocephalic, without obvious abnormality, atraumatic Neck: no adenopathy, supple, symmetrical, trachea midline and thyroid {EXAM; THYROID:18604} Lungs: clear to auscultation bilaterally Breasts: {Exam; breast:13139::"normal appearance, no masses or tenderness"} Heart: regular rate and rhythm Abdomen: soft, non-tender; bowel sounds normal; no masses,  no organomegaly Extremities: extremities normal, atraumatic, no cyanosis or edema Skin: Skin color, texture, turgor normal. No rashes or lesions Lymph nodes: Cervical, supraclavicular, and axillary nodes normal. No abnormal inguinal nodes palpated Neurologic: Grossly normal   Pelvic: External genitalia:  no lesions              Urethra:  normal appearing urethra with no masses, tenderness or lesions              Bartholins and Skenes: normal                 Vagina: normal appearing vagina with normal color and no discharge, no lesions              Cervix: {exam; cervix:14595}              Pap taken: {yes no:314532} Bimanual Exam:  Uterus:  {exam; uterus:12215}              Adnexa: {exam; adnexa:12223}               Rectovaginal: Confirms               Anus:  normal sphincter tone, no lesions  Chaperone, ***, CMA, was present for exam.  Assessment/Plan:

## 2023-04-25 DIAGNOSIS — Z9882 Breast implant status: Secondary | ICD-10-CM | POA: Insufficient documentation

## 2023-04-27 DIAGNOSIS — I10 Essential (primary) hypertension: Secondary | ICD-10-CM | POA: Diagnosis not present

## 2023-04-27 DIAGNOSIS — E785 Hyperlipidemia, unspecified: Secondary | ICD-10-CM | POA: Diagnosis not present

## 2023-04-27 DIAGNOSIS — Z0000 Encounter for general adult medical examination without abnormal findings: Secondary | ICD-10-CM | POA: Diagnosis not present

## 2023-04-27 LAB — CYTOLOGY - PAP
Comment: NEGATIVE
Diagnosis: NEGATIVE
High risk HPV: NEGATIVE

## 2023-05-14 DIAGNOSIS — Z79891 Long term (current) use of opiate analgesic: Secondary | ICD-10-CM | POA: Diagnosis not present

## 2023-05-14 DIAGNOSIS — M5136 Other intervertebral disc degeneration, lumbar region: Secondary | ICD-10-CM | POA: Diagnosis not present

## 2023-05-14 DIAGNOSIS — G894 Chronic pain syndrome: Secondary | ICD-10-CM | POA: Diagnosis not present

## 2023-05-14 DIAGNOSIS — M5416 Radiculopathy, lumbar region: Secondary | ICD-10-CM | POA: Diagnosis not present

## 2023-05-14 DIAGNOSIS — M62830 Muscle spasm of back: Secondary | ICD-10-CM | POA: Diagnosis not present

## 2023-05-21 DIAGNOSIS — Z131 Encounter for screening for diabetes mellitus: Secondary | ICD-10-CM | POA: Diagnosis not present

## 2023-05-21 DIAGNOSIS — R7301 Impaired fasting glucose: Secondary | ICD-10-CM | POA: Diagnosis not present

## 2023-06-15 DIAGNOSIS — M51369 Other intervertebral disc degeneration, lumbar region without mention of lumbar back pain or lower extremity pain: Secondary | ICD-10-CM | POA: Diagnosis not present

## 2023-06-15 DIAGNOSIS — G894 Chronic pain syndrome: Secondary | ICD-10-CM | POA: Diagnosis not present

## 2023-06-15 DIAGNOSIS — M5416 Radiculopathy, lumbar region: Secondary | ICD-10-CM | POA: Diagnosis not present

## 2023-06-15 DIAGNOSIS — M62830 Muscle spasm of back: Secondary | ICD-10-CM | POA: Diagnosis not present

## 2023-07-08 ENCOUNTER — Encounter (HOSPITAL_BASED_OUTPATIENT_CLINIC_OR_DEPARTMENT_OTHER): Payer: Self-pay | Admitting: Certified Nurse Midwife

## 2023-07-08 ENCOUNTER — Ambulatory Visit (HOSPITAL_BASED_OUTPATIENT_CLINIC_OR_DEPARTMENT_OTHER): Payer: BC Managed Care – PPO | Admitting: Certified Nurse Midwife

## 2023-07-08 VITALS — BP 129/97 | HR 88 | Ht 60.5 in | Wt 135.6 lb

## 2023-07-08 DIAGNOSIS — S3141XA Laceration without foreign body of vagina and vulva, initial encounter: Secondary | ICD-10-CM

## 2023-07-08 MED ORDER — ESTRADIOL 0.1 MG/GM VA CREA: 1.0000 | TOPICAL_CREAM | Freq: Every day | VAGINAL | 12 refills | Status: DC

## 2023-07-08 NOTE — Progress Notes (Unsigned)
   Subjective:     Shawna Dean is a 61 y.o. female postmenopausal who presents for evaluation of some bleeding after intercourse. She and spouse were having intercourse and he noticed bright red blood. Pt continued to have some bright red bleeding from vagina which has turned to spotting.   The following portions of the patient's history were reviewed and updated as appropriate: allergies, current medications, past family history, past medical history, past social history, past surgical history, and problem list.   Review of Systems Pertinent items are noted in HPI.    Objective:    BP (!) 129/97 (BP Location: Left Arm, Patient Position: Sitting, Cuff Size: Normal)   Pulse 88   Ht 5' 0.5" (1.537 m)   Wt 135 lb 9.6 oz (61.5 kg)   LMP 03/02/2011   BMI 26.05 kg/m  Pelvic: external genitalia normal and upon speculum exam, there is a left sidewall tear approx 1/2 cm that appears to be healing.     Assessment:    Left vaginal sidewall tear (after intercourse) .    Plan:    Pt will remain abstinent x 2 weeks Estrogen cream BID for 2 weeks then twice weekly She will call our office if the bleeding persists and does not resolve over the next few days.  Letta Kocher

## 2023-07-09 DIAGNOSIS — S3141XA Laceration without foreign body of vagina and vulva, initial encounter: Secondary | ICD-10-CM | POA: Insufficient documentation

## 2023-07-16 DIAGNOSIS — M62830 Muscle spasm of back: Secondary | ICD-10-CM | POA: Diagnosis not present

## 2023-07-16 DIAGNOSIS — G894 Chronic pain syndrome: Secondary | ICD-10-CM | POA: Diagnosis not present

## 2023-07-16 DIAGNOSIS — M5416 Radiculopathy, lumbar region: Secondary | ICD-10-CM | POA: Diagnosis not present

## 2023-08-07 ENCOUNTER — Encounter (HOSPITAL_BASED_OUTPATIENT_CLINIC_OR_DEPARTMENT_OTHER): Payer: Self-pay | Admitting: Certified Nurse Midwife

## 2023-08-07 ENCOUNTER — Ambulatory Visit (HOSPITAL_BASED_OUTPATIENT_CLINIC_OR_DEPARTMENT_OTHER): Payer: BC Managed Care – PPO | Admitting: Certified Nurse Midwife

## 2023-08-07 VITALS — BP 136/89 | HR 74 | Ht 60.5 in | Wt 139.0 lb

## 2023-08-07 DIAGNOSIS — S3141XD Laceration without foreign body of vagina and vulva, subsequent encounter: Secondary | ICD-10-CM | POA: Diagnosis not present

## 2023-08-07 MED ORDER — MUPIROCIN 2 % EX OINT: 1.0000 | TOPICAL_OINTMENT | Freq: Two times a day (BID) | CUTANEOUS | 5 refills | Status: DC

## 2023-08-07 NOTE — Progress Notes (Signed)
  Shawna Dean is a 62yo MWF here for follow-up. On 07/08/23 she was evaluated for a left vaginal sidewall tear that occurred during intercourse.That was the first time they had tried intercourse in approx one year.  She was not on estrogen therapy at that time. Pt states she "feels better" and is not currently experiencing any vaginal spotting or bleeding. She started the estradiol vaginally nightly x 2 weeks and is now using it twice weekly. She declines a speculum exam today. Pt requested a refill on nasal Bactroban that she uses occasionally. Rx sent.  Assessment: Left vaginal sidewall tear, healing  Plan: Pt will continue to use estrogen vaginally twice weekly at bedtime. She will consider lubricant w/ intercourse. Discussed Vitamin E capsules vaginally if desired. She is aware of Replens for vaginal moisturization. RTO as scheduled for next annual gyn exam and prn if issues arise.  Letta Kocher

## 2023-08-13 DIAGNOSIS — M5416 Radiculopathy, lumbar region: Secondary | ICD-10-CM | POA: Diagnosis not present

## 2023-08-13 DIAGNOSIS — M51369 Other intervertebral disc degeneration, lumbar region without mention of lumbar back pain or lower extremity pain: Secondary | ICD-10-CM | POA: Diagnosis not present

## 2023-08-13 DIAGNOSIS — G894 Chronic pain syndrome: Secondary | ICD-10-CM | POA: Diagnosis not present

## 2023-08-13 DIAGNOSIS — M62830 Muscle spasm of back: Secondary | ICD-10-CM | POA: Diagnosis not present

## 2023-08-18 DIAGNOSIS — M4316 Spondylolisthesis, lumbar region: Secondary | ICD-10-CM | POA: Diagnosis not present

## 2023-08-18 DIAGNOSIS — Z981 Arthrodesis status: Secondary | ICD-10-CM | POA: Diagnosis not present

## 2023-08-18 DIAGNOSIS — M47816 Spondylosis without myelopathy or radiculopathy, lumbar region: Secondary | ICD-10-CM | POA: Diagnosis not present

## 2023-08-21 ENCOUNTER — Encounter (HOSPITAL_BASED_OUTPATIENT_CLINIC_OR_DEPARTMENT_OTHER): Payer: Self-pay | Admitting: Certified Nurse Midwife

## 2023-08-24 ENCOUNTER — Other Ambulatory Visit (HOSPITAL_BASED_OUTPATIENT_CLINIC_OR_DEPARTMENT_OTHER): Payer: Self-pay | Admitting: Certified Nurse Midwife

## 2023-08-24 MED ORDER — ESTRADIOL 0.1 MG/GM VA CREA: 1.0000 | TOPICAL_CREAM | Freq: Every day | VAGINAL | 12 refills | Status: DC

## 2023-09-10 DIAGNOSIS — M19012 Primary osteoarthritis, left shoulder: Secondary | ICD-10-CM | POA: Diagnosis not present

## 2023-09-10 DIAGNOSIS — G894 Chronic pain syndrome: Secondary | ICD-10-CM | POA: Diagnosis not present

## 2023-09-10 DIAGNOSIS — M5416 Radiculopathy, lumbar region: Secondary | ICD-10-CM | POA: Diagnosis not present

## 2023-09-10 DIAGNOSIS — M62830 Muscle spasm of back: Secondary | ICD-10-CM | POA: Diagnosis not present

## 2023-09-10 DIAGNOSIS — M51369 Other intervertebral disc degeneration, lumbar region without mention of lumbar back pain or lower extremity pain: Secondary | ICD-10-CM | POA: Diagnosis not present

## 2023-12-03 DIAGNOSIS — M62830 Muscle spasm of back: Secondary | ICD-10-CM | POA: Diagnosis not present

## 2023-12-03 DIAGNOSIS — M5136 Other intervertebral disc degeneration, lumbar region with discogenic back pain only: Secondary | ICD-10-CM | POA: Diagnosis not present

## 2023-12-03 DIAGNOSIS — G894 Chronic pain syndrome: Secondary | ICD-10-CM | POA: Diagnosis not present

## 2023-12-03 DIAGNOSIS — M5416 Radiculopathy, lumbar region: Secondary | ICD-10-CM | POA: Diagnosis not present

## 2023-12-17 DIAGNOSIS — M4316 Spondylolisthesis, lumbar region: Secondary | ICD-10-CM | POA: Diagnosis not present

## 2023-12-31 DIAGNOSIS — M5416 Radiculopathy, lumbar region: Secondary | ICD-10-CM | POA: Diagnosis not present

## 2023-12-31 DIAGNOSIS — M5136 Other intervertebral disc degeneration, lumbar region with discogenic back pain only: Secondary | ICD-10-CM | POA: Diagnosis not present

## 2023-12-31 DIAGNOSIS — M62830 Muscle spasm of back: Secondary | ICD-10-CM | POA: Diagnosis not present

## 2023-12-31 DIAGNOSIS — G894 Chronic pain syndrome: Secondary | ICD-10-CM | POA: Diagnosis not present

## 2024-01-05 DIAGNOSIS — M5126 Other intervertebral disc displacement, lumbar region: Secondary | ICD-10-CM | POA: Diagnosis not present

## 2024-01-05 DIAGNOSIS — M5137 Other intervertebral disc degeneration, lumbosacral region with discogenic back pain only: Secondary | ICD-10-CM | POA: Diagnosis not present

## 2024-01-05 DIAGNOSIS — M48061 Spinal stenosis, lumbar region without neurogenic claudication: Secondary | ICD-10-CM | POA: Diagnosis not present

## 2024-01-05 DIAGNOSIS — M4316 Spondylolisthesis, lumbar region: Secondary | ICD-10-CM | POA: Diagnosis not present

## 2024-01-19 DIAGNOSIS — M47816 Spondylosis without myelopathy or radiculopathy, lumbar region: Secondary | ICD-10-CM | POA: Diagnosis not present

## 2024-01-19 DIAGNOSIS — M4316 Spondylolisthesis, lumbar region: Secondary | ICD-10-CM | POA: Diagnosis not present

## 2024-01-28 DIAGNOSIS — M62830 Muscle spasm of back: Secondary | ICD-10-CM | POA: Diagnosis not present

## 2024-01-28 DIAGNOSIS — M5416 Radiculopathy, lumbar region: Secondary | ICD-10-CM | POA: Diagnosis not present

## 2024-01-28 DIAGNOSIS — M5136 Other intervertebral disc degeneration, lumbar region with discogenic back pain only: Secondary | ICD-10-CM | POA: Diagnosis not present

## 2024-01-28 DIAGNOSIS — G894 Chronic pain syndrome: Secondary | ICD-10-CM | POA: Diagnosis not present

## 2024-02-01 ENCOUNTER — Other Ambulatory Visit: Payer: Self-pay | Admitting: Neurosurgery

## 2024-02-23 DIAGNOSIS — G894 Chronic pain syndrome: Secondary | ICD-10-CM | POA: Diagnosis not present

## 2024-02-23 DIAGNOSIS — M62830 Muscle spasm of back: Secondary | ICD-10-CM | POA: Diagnosis not present

## 2024-02-23 DIAGNOSIS — M5416 Radiculopathy, lumbar region: Secondary | ICD-10-CM | POA: Diagnosis not present

## 2024-02-23 DIAGNOSIS — M5136 Other intervertebral disc degeneration, lumbar region with discogenic back pain only: Secondary | ICD-10-CM | POA: Diagnosis not present

## 2024-02-25 DIAGNOSIS — M4316 Spondylolisthesis, lumbar region: Secondary | ICD-10-CM | POA: Diagnosis not present

## 2024-02-29 ENCOUNTER — Other Ambulatory Visit: Payer: Self-pay

## 2024-02-29 ENCOUNTER — Encounter (HOSPITAL_COMMUNITY): Payer: Self-pay

## 2024-02-29 ENCOUNTER — Inpatient Hospital Stay (HOSPITAL_COMMUNITY)
Admission: RE | Admit: 2024-02-29 | Discharge: 2024-02-29 | Disposition: A | Discharge status: Home / Self Care | Source: Ambulatory Visit | Attending: Neurosurgery

## 2024-02-29 VITALS — BP 116/77 | HR 65 | Temp 97.7°F | Resp 16 | Ht 63.0 in | Wt 146.1 lb

## 2024-02-29 DIAGNOSIS — I498 Other specified cardiac arrhythmias: Secondary | ICD-10-CM | POA: Insufficient documentation

## 2024-02-29 DIAGNOSIS — Z01818 Encounter for other preprocedural examination: Secondary | ICD-10-CM | POA: Insufficient documentation

## 2024-02-29 LAB — CBC
HCT: 40.9 % (ref 36.0–46.0)
Hemoglobin: 13.1 g/dL (ref 12.0–15.0)
MCH: 28.8 pg (ref 26.0–34.0)
MCHC: 32 g/dL (ref 30.0–36.0)
MCV: 89.9 fL (ref 80.0–100.0)
Platelets: 209 10*3/uL (ref 150–400)
RBC: 4.55 MIL/uL (ref 3.87–5.11)
RDW: 12.6 % (ref 11.5–15.5)
WBC: 5 10*3/uL (ref 4.0–10.5)
nRBC: 0 % (ref 0.0–0.2)

## 2024-02-29 LAB — TYPE AND SCREEN
ABO/RH(D): O POS
Antibody Screen: NEGATIVE

## 2024-02-29 LAB — BASIC METABOLIC PANEL WITH GFR
Anion gap: 8 (ref 5–15)
BUN: 5 mg/dL — ABNORMAL LOW (ref 8–23)
CO2: 28 mmol/L (ref 22–32)
Calcium: 9.4 mg/dL (ref 8.9–10.3)
Chloride: 100 mmol/L (ref 98–111)
Creatinine, Ser: 0.71 mg/dL (ref 0.44–1.00)
GFR, Estimated: >60 mL/min (ref >60)
Glucose, Bld: 97 mg/dL (ref 70–99)
Potassium: 4.1 mmol/L (ref 3.5–5.1)
Sodium: 136 mmol/L (ref 135–145)

## 2024-02-29 LAB — SURGICAL PCR SCREEN
MRSA, PCR: NEGATIVE
Staphylococcus aureus: NEGATIVE

## 2024-02-29 NOTE — Progress Notes (Signed)
 PCP - Lamar Hugh COME Cardiologist - denies  PPM/ICD - denies Device Orders -  Rep Notified -   Chest x-ray - na EKG - 02/29/24 Stress Test - denies ECHO - denies Cardiac Cath - denies  Sleep Study - denies CPAP -   Fasting Blood Sugar - na Checks Blood Sugar _____ times a day  Last dose of GLP1 agonist-  na GLP1 instructions:   Blood Thinner Instructions:na Aspirin Instructions:na  ERAS Protcol -no PRE-SURGERY Ensure or G2-   COVID TEST- na   Anesthesia review: no  Patient denies shortness of breath, fever, cough and chest pain at PAT appointment   All instructions explained to the patient, with a verbal understanding of the material. Patient agrees to go over the instructions while at home for a better understanding. The opportunity to ask questions was provided.

## 2024-02-29 NOTE — Progress Notes (Addendum)
 Surgical Instructions   Your procedure is scheduled on Wednesday July 9. Report to [facility] Main Entrance A at 7:45 A.M., then check in with the Admitting office. Any questions or running late day of surgery: call [phone]  Questions prior to your surgery date: call [phone], Monday-Friday, 8am-4pm. If you experience any cold or flu symptoms such as cough, fever, chills, shortness of breath, etc. between now and your scheduled surgery, please notify us  at the above number.     Remember:  Do not eat or drink anything after midnight the night before your surgery.    Take these medicines the morning of surgery with A SIP OF WATER  DULoxetine  (CYMBALTA )  rosuvastatin (CRESTOR)   May take these medicines IF NEEDED: cyclobenzaprine  (FLEXERIL )  PERCOCET   One week prior to surgery, STOP taking any Aspirin (unless otherwise instructed by your surgeon) Aleve, Naproxen, Ibuprofen, Motrin, Advil, Goody's, BC's, all herbal medications, fish oil, and non-prescription vitamins.                     Do NOT Smoke (Tobacco/Vaping) for 24 hours prior to your procedure.  If you use a CPAP at night, you may bring your mask/headgear for your overnight stay.   You will be asked to remove any contacts, glasses, piercing's, hearing aid's, dentures/partials prior to surgery. Please bring cases for these items if needed.    Patients discharged the day of surgery will not be allowed to drive home, and someone needs to stay with them for 24 hours.  SURGICAL WAITING ROOM VISITATION Patients may have no more than 2 support people in the waiting area - these visitors may rotate.   Pre-op nurse will coordinate an appropriate time for 1 ADULT support person, who may not rotate, to accompany patient in pre-op.  Children under the age of 45 must have an adult with them who is not the patient and must remain in the main waiting area with an adult.  If the patient needs to stay at the hospital during  part of their recovery, the visitor guidelines for inpatient rooms apply.  Please refer to the [facility] website for the visitor guidelines for any additional information.   If you received a COVID test during your pre-op visit  it is requested that you wear a mask when out in public, stay away from anyone that may not be feeling well and notify your surgeon if you develop symptoms. If you have been in contact with anyone that has tested positive in the last 10 days please notify you surgeon.      Pre-operative 5 CHG Bathing Instructions   You can play a key role in reducing the risk of infection after surgery. Your skin needs to be as free of germs as possible. You can reduce the number of germs on your skin by washing with CHG (chlorhexidine  gluconate) soap before surgery. CHG is an antiseptic soap that kills germs and continues to kill germs even after washing.   DO NOT use if you have an allergy to chlorhexidine /CHG or antibacterial soaps. If your skin becomes reddened or irritated, stop using the CHG and notify one of our RNs at [phone].   Please shower with the CHG soap starting 4 days before surgery using the following schedule:     Please keep in mind the following:  DO NOT shave, including legs and underarms, starting the day of your first shower.   You may shave your face at any point  before/day of surgery.  Place clean sheets on your bed the day you start using CHG soap. Use a clean washcloth (not used since being washed) for each shower. DO NOT sleep with pets once you start using the CHG.   CHG Shower Instructions:  Wash your face and private area with normal soap. If you choose to wash your hair, wash first with your normal shampoo.  After you use shampoo/soap, rinse your hair and body thoroughly to remove shampoo/soap residue.  Turn the water OFF and apply about 3 tablespoons (45 ml) of CHG soap to a CLEAN washcloth.  Apply CHG soap ONLY FROM YOUR NECK DOWN TO  YOUR TOES (washing for 3-5 minutes)  DO NOT use CHG soap on face, private areas, open wounds, or sores.  Pay special attention to the area where your surgery is being performed.  If you are having back surgery, having someone wash your back for you may be helpful. Wait 2 minutes after CHG soap is applied, then you may rinse off the CHG soap.  Pat dry with a clean towel  Put on clean clothes/pajamas   If you choose to wear lotion, please use ONLY the CHG-compatible lotions that are listed below.  Additional instructions for the day of surgery: DO NOT APPLY any lotions, deodorants, cologne, or perfumes.   Do not bring valuables to the hospital. [facility] is not responsible for any belongings/valuables. Do not wear nail polish, gel polish, artificial nails, or any other type of covering on natural nails (fingers and toes) Do not wear jewelry or makeup Put on clean/comfortable clothes.  Please brush your teeth.  Ask your nurse before applying any prescription medications to the skin.     CHG Compatible Lotions   Aveeno Moisturizing lotion  Cetaphil Moisturizing Cream  Cetaphil Moisturizing Lotion  Clairol Herbal Essence Moisturizing Lotion, Dry Skin  Clairol Herbal Essence Moisturizing Lotion, Extra Dry Skin  Clairol Herbal Essence Moisturizing Lotion, Normal Skin  Curel Age Defying Therapeutic Moisturizing Lotion with Alpha Hydroxy  Curel Extreme Care Body Lotion  Curel Soothing Hands Moisturizing Hand Lotion  Curel Therapeutic Moisturizing Cream, Fragrance-Free  Curel Therapeutic Moisturizing Lotion, Fragrance-Free  Curel Therapeutic Moisturizing Lotion, Original Formula  Eucerin Daily Replenishing Lotion  Eucerin Dry Skin Therapy Plus Alpha Hydroxy Crme  Eucerin Dry Skin Therapy Plus Alpha Hydroxy Lotion  Eucerin Original Crme  Eucerin Original Lotion  Eucerin Plus Crme Eucerin Plus Lotion  Eucerin TriLipid Replenishing Lotion  Keri Anti-Bacterial Hand Lotion  Keri Deep  Conditioning Original Lotion Dry Skin Formula Softly Scented  Keri Deep Conditioning Original Lotion, Fragrance Free Sensitive Skin Formula  Keri Lotion Fast Absorbing Fragrance Free Sensitive Skin Formula  Keri Lotion Fast Absorbing Softly Scented Dry Skin Formula  Keri Original Lotion  Keri Skin Renewal Lotion Keri Silky Smooth Lotion  Keri Silky Smooth Sensitive Skin Lotion  Nivea Body Creamy Conditioning Oil  Nivea Body Extra Enriched Lotion  Nivea Body Original Lotion  Nivea Body Sheer Moisturizing Lotion Nivea Crme  Nivea Skin Firming Lotion  NutraDerm 30 Skin Lotion  NutraDerm Skin Lotion  NutraDerm Therapeutic Skin Cream  NutraDerm Therapeutic Skin Lotion  ProShield Protective Hand Cream  Provon moisturizing lotion  Please read over the following fact sheets that you were given.

## 2024-03-01 HISTORY — PX: BACK SURGERY: SHX140

## 2024-03-09 ENCOUNTER — Inpatient Hospital Stay (HOSPITAL_COMMUNITY)

## 2024-03-09 ENCOUNTER — Other Ambulatory Visit: Payer: Self-pay

## 2024-03-09 ENCOUNTER — Encounter (HOSPITAL_COMMUNITY): Payer: Self-pay | Admitting: Neurosurgery

## 2024-03-09 ENCOUNTER — Encounter (HOSPITAL_COMMUNITY)
Admission: RE | Disposition: A | Discharge status: Home / Self Care | Payer: Self-pay | Source: Home / Self Care | Attending: Neurosurgery

## 2024-03-09 ENCOUNTER — Inpatient Hospital Stay (HOSPITAL_COMMUNITY)
Admission: RE | Admit: 2024-03-09 | Discharge: 2024-03-11 | DRG: 428 | Disposition: A | Discharge status: Home / Self Care | Payer: Self-pay | Attending: Neurosurgery | Admitting: Neurosurgery

## 2024-03-09 DIAGNOSIS — Z91030 Bee allergy status: Secondary | ICD-10-CM | POA: Diagnosis not present

## 2024-03-09 DIAGNOSIS — M5136 Other intervertebral disc degeneration, lumbar region with discogenic back pain only: Secondary | ICD-10-CM | POA: Diagnosis not present

## 2024-03-09 DIAGNOSIS — M51369 Other intervertebral disc degeneration, lumbar region without mention of lumbar back pain or lower extremity pain: Secondary | ICD-10-CM | POA: Diagnosis present

## 2024-03-09 DIAGNOSIS — Z860101 Personal history of adenomatous and serrated colon polyps: Secondary | ICD-10-CM

## 2024-03-09 DIAGNOSIS — Z881 Allergy status to other antibiotic agents status: Secondary | ICD-10-CM | POA: Diagnosis not present

## 2024-03-09 DIAGNOSIS — Z91038 Other insect allergy status: Secondary | ICD-10-CM | POA: Diagnosis not present

## 2024-03-09 DIAGNOSIS — Z79899 Other long term (current) drug therapy: Secondary | ICD-10-CM | POA: Diagnosis not present

## 2024-03-09 DIAGNOSIS — Z886 Allergy status to analgesic agent status: Secondary | ICD-10-CM | POA: Diagnosis not present

## 2024-03-09 DIAGNOSIS — I10 Essential (primary) hypertension: Secondary | ICD-10-CM | POA: Diagnosis present

## 2024-03-09 DIAGNOSIS — Z8619 Personal history of other infectious and parasitic diseases: Secondary | ICD-10-CM

## 2024-03-09 DIAGNOSIS — Z87891 Personal history of nicotine dependence: Secondary | ICD-10-CM

## 2024-03-09 DIAGNOSIS — E785 Hyperlipidemia, unspecified: Secondary | ICD-10-CM | POA: Diagnosis not present

## 2024-03-09 DIAGNOSIS — M48062 Spinal stenosis, lumbar region with neurogenic claudication: Secondary | ICD-10-CM | POA: Diagnosis not present

## 2024-03-09 DIAGNOSIS — M4316 Spondylolisthesis, lumbar region: Secondary | ICD-10-CM | POA: Diagnosis not present

## 2024-03-09 DIAGNOSIS — Z8249 Family history of ischemic heart disease and other diseases of the circulatory system: Secondary | ICD-10-CM | POA: Diagnosis not present

## 2024-03-09 DIAGNOSIS — Z0189 Encounter for other specified special examinations: Secondary | ICD-10-CM | POA: Diagnosis not present

## 2024-03-09 DIAGNOSIS — M48061 Spinal stenosis, lumbar region without neurogenic claudication: Principal | ICD-10-CM | POA: Diagnosis present

## 2024-03-09 SURGERY — POSTERIOR LUMBAR FUSION 2 LEVEL
Anesthesia: General | Site: Back

## 2024-03-09 MED ORDER — PHENYLEPHRINE HCL-NACL 20-0.9 MG/250ML-% IV SOLN
INTRAVENOUS | Status: DC | PRN
  Administered 2024-03-09: 20 ug/min via INTRAVENOUS

## 2024-03-09 MED ORDER — SCOPOLAMINE 1 MG/3DAYS TD PT72
1.0000 | MEDICATED_PATCH | TRANSDERMAL | Status: DC
  Administered 2024-03-09: 1.5 mg via TRANSDERMAL

## 2024-03-09 MED ORDER — ELDERBERRY 500 MG PO CAPS: ORAL_CAPSULE | Freq: Every day | ORAL | Status: DC

## 2024-03-09 MED ORDER — CEFAZOLIN SODIUM-DEXTROSE 2-4 GM/100ML-% IV SOLN
2.0000 g | Freq: Three times a day (TID) | INTRAVENOUS | Status: AC
  Administered 2024-03-09 – 2024-03-10 (×2): 2 g via INTRAVENOUS
  Filled 2024-03-09 (×2): qty 100

## 2024-03-09 MED ORDER — LACTATED RINGERS IV SOLN: INTRAVENOUS | Status: DC

## 2024-03-09 MED ORDER — SUGAMMADEX SODIUM 200 MG/2ML IV SOLN
INTRAVENOUS | Status: DC | PRN
  Administered 2024-03-09: 200 mg via INTRAVENOUS

## 2024-03-09 MED ORDER — CALCIUM CARB-CHOLECALCIFEROL 600-20 MG-MCG PO TABS: ORAL_TABLET | Freq: Every day | ORAL | Status: DC

## 2024-03-09 MED ORDER — LIDOCAINE-EPINEPHRINE 1 %-1:100000 IJ SOLN
INTRAMUSCULAR | Status: AC
  Filled 2024-03-09: qty 1

## 2024-03-09 MED ORDER — MIDAZOLAM HCL 2 MG/2ML IJ SOLN
INTRAMUSCULAR | Status: DC | PRN
  Administered 2024-03-09: 2 mg via INTRAVENOUS

## 2024-03-09 MED ORDER — ESTRADIOL 0.1 MG/GM VA CREA
1.0000 | TOPICAL_CREAM | VAGINAL | Status: DC
  Filled 2024-03-09: qty 42.5

## 2024-03-09 MED ORDER — FENTANYL CITRATE (PF) 100 MCG/2ML IJ SOLN
INTRAMUSCULAR | Status: AC
  Filled 2024-03-09: qty 2

## 2024-03-09 MED ORDER — ONDANSETRON HCL 4 MG/2ML IJ SOLN
4.0000 mg | Freq: Four times a day (QID) | INTRAMUSCULAR | Status: DC | PRN
Start: 2024-03-09 — End: 2024-03-11
  Administered 2024-03-09 – 2024-03-10 (×2): 4 mg via INTRAVENOUS
  Filled 2024-03-09 (×2): qty 2

## 2024-03-09 MED ORDER — OXYCODONE HCL 5 MG PO TABS: 5.0000 mg | ORAL_TABLET | Freq: Once | ORAL | Status: DC | PRN

## 2024-03-09 MED ORDER — ACETAMINOPHEN 10 MG/ML IV SOLN
INTRAVENOUS | Status: AC
  Filled 2024-03-09: qty 100

## 2024-03-09 MED ORDER — ROCURONIUM BROMIDE 10 MG/ML (PF) SYRINGE
PREFILLED_SYRINGE | INTRAVENOUS | Status: DC | PRN
  Administered 2024-03-09: 15 mg via INTRAVENOUS
  Administered 2024-03-09: 20 mg via INTRAVENOUS
  Administered 2024-03-09: 35 mg via INTRAVENOUS
  Administered 2024-03-09 (×2): 15 mg via INTRAVENOUS
  Administered 2024-03-09: 20 mg via INTRAVENOUS
  Administered 2024-03-09: 50 mg via INTRAVENOUS
  Administered 2024-03-09: 30 mg via INTRAVENOUS

## 2024-03-09 MED ORDER — AMISULPRIDE (ANTIEMETIC) 5 MG/2ML IV SOLN
INTRAVENOUS | Status: AC
  Filled 2024-03-09: qty 4

## 2024-03-09 MED ORDER — OXYCODONE-ACETAMINOPHEN 10-325 MG PO TABS: 1.0000 | ORAL_TABLET | ORAL | Status: DC | PRN

## 2024-03-09 MED ORDER — ACETAMINOPHEN 10 MG/ML IV SOLN
1000.0000 mg | Freq: Once | INTRAVENOUS | Status: DC | PRN
  Administered 2024-03-09: 1000 mg via INTRAVENOUS

## 2024-03-09 MED ORDER — SODIUM CHLORIDE 0.9% FLUSH
3.0000 mL | INTRAVENOUS | Status: DC | PRN
Start: 2024-03-09 — End: 2024-03-11

## 2024-03-09 MED ORDER — TRAZODONE 25 MG HALF TABLET: 25.0000 mg | ORAL_TABLET | Freq: Every evening | ORAL | Status: DC | PRN

## 2024-03-09 MED ORDER — ACETAMINOPHEN 325 MG PO TABS
650.0000 mg | ORAL_TABLET | ORAL | Status: DC | PRN
Start: 2024-03-09 — End: 2024-03-11

## 2024-03-09 MED ORDER — CHLORHEXIDINE GLUCONATE CLOTH 2 % EX PADS: 6.0000 | MEDICATED_PAD | Freq: Once | CUTANEOUS | Status: DC

## 2024-03-09 MED ORDER — DEXAMETHASONE SODIUM PHOSPHATE 10 MG/ML IJ SOLN
INTRAMUSCULAR | Status: DC | PRN
  Administered 2024-03-09: 10 mg via INTRAVENOUS

## 2024-03-09 MED ORDER — ORAL CARE MOUTH RINSE: 15.0000 mL | Freq: Once | OROMUCOSAL | Status: AC

## 2024-03-09 MED ORDER — BUPIVACAINE HCL (PF) 0.25 % IJ SOLN
INTRAMUSCULAR | Status: AC
  Filled 2024-03-09: qty 30

## 2024-03-09 MED ORDER — PANTOPRAZOLE SODIUM 40 MG IV SOLR: 40.0000 mg | Freq: Every day | INTRAVENOUS | Status: DC

## 2024-03-09 MED ORDER — LIDOCAINE 2% (20 MG/ML) 5 ML SYRINGE
INTRAMUSCULAR | Status: DC | PRN
  Administered 2024-03-09: 50 mg via INTRAVENOUS

## 2024-03-09 MED ORDER — OYSTER SHELL CALCIUM/D3 500-5 MG-MCG PO TABS
1.0000 | ORAL_TABLET | Freq: Every day | ORAL | Status: DC
  Administered 2024-03-09: 1 via ORAL
  Filled 2024-03-09 (×2): qty 1

## 2024-03-09 MED ORDER — BUPIVACAINE LIPOSOME 1.3 % IJ SUSP
INTRAMUSCULAR | Status: AC
  Filled 2024-03-09: qty 20

## 2024-03-09 MED ORDER — MENTHOL 3 MG MT LOZG: 1.0000 | LOZENGE | OROMUCOSAL | Status: DC | PRN

## 2024-03-09 MED ORDER — HYDROMORPHONE HCL 1 MG/ML IJ SOLN
0.5000 mg | INTRAMUSCULAR | Status: DC | PRN
  Administered 2024-03-09: 0.5 mg via INTRAVENOUS
  Filled 2024-03-09: qty 0.5

## 2024-03-09 MED ORDER — ONDANSETRON HCL 4 MG PO TABS
4.0000 mg | ORAL_TABLET | Freq: Four times a day (QID) | ORAL | Status: DC | PRN
  Administered 2024-03-10 – 2024-03-11 (×2): 4 mg via ORAL
  Filled 2024-03-09 (×2): qty 1

## 2024-03-09 MED ORDER — OXYCODONE-ACETAMINOPHEN 5-325 MG PO TABS
1.0000 | ORAL_TABLET | ORAL | Status: DC | PRN
  Administered 2024-03-09 – 2024-03-11 (×6): 1 via ORAL
  Filled 2024-03-09 (×6): qty 1

## 2024-03-09 MED ORDER — HYDROMORPHONE HCL 1 MG/ML IJ SOLN
0.2500 mg | INTRAMUSCULAR | Status: DC | PRN
  Administered 2024-03-09 (×2): 0.5 mg via INTRAVENOUS

## 2024-03-09 MED ORDER — LIDOCAINE-EPINEPHRINE 1 %-1:100000 IJ SOLN
INTRAMUSCULAR | Status: DC | PRN
  Administered 2024-03-09: 10 mL

## 2024-03-09 MED ORDER — 0.9 % SODIUM CHLORIDE (POUR BTL) OPTIME
TOPICAL | Status: DC | PRN
  Administered 2024-03-09: 1000 mL

## 2024-03-09 MED ORDER — ONDANSETRON HCL 4 MG/2ML IJ SOLN: 4.0000 mg | Freq: Four times a day (QID) | INTRAMUSCULAR | Status: DC | PRN

## 2024-03-09 MED ORDER — METHYLPHENIDATE HCL ER (OSM) 27 MG PO TBCR: 54.0000 mg | EXTENDED_RELEASE_TABLET | Freq: Every morning | ORAL | Status: DC

## 2024-03-09 MED ORDER — FENTANYL CITRATE (PF) 100 MCG/2ML IJ SOLN
25.0000 ug | INTRAMUSCULAR | Status: DC | PRN
  Administered 2024-03-09 (×3): 50 ug via INTRAVENOUS

## 2024-03-09 MED ORDER — DEXMEDETOMIDINE HCL IN NACL 80 MCG/20ML IV SOLN
INTRAVENOUS | Status: DC | PRN
Start: 2024-03-09 — End: 2024-03-09
  Administered 2024-03-09: 4 ug via INTRAVENOUS
  Administered 2024-03-09: 8 ug via INTRAVENOUS
  Administered 2024-03-09 (×5): 4 ug via INTRAVENOUS

## 2024-03-09 MED ORDER — SODIUM CHLORIDE 0.9 % IV SOLN
250.0000 mL | INTRAVENOUS | Status: AC
  Administered 2024-03-09: 250 mL via INTRAVENOUS

## 2024-03-09 MED ORDER — FENTANYL CITRATE (PF) 250 MCG/5ML IJ SOLN
INTRAMUSCULAR | Status: DC | PRN
  Administered 2024-03-09 (×5): 50 ug via INTRAVENOUS

## 2024-03-09 MED ORDER — PHENOL 1.4 % MT LIQD: 1.0000 | OROMUCOSAL | Status: DC | PRN

## 2024-03-09 MED ORDER — PROPOFOL 10 MG/ML IV BOLUS
INTRAVENOUS | Status: DC | PRN
  Administered 2024-03-09: 40 mg via INTRAVENOUS
  Administered 2024-03-09: 160 mg via INTRAVENOUS
  Administered 2024-03-09: 25 ug/kg/min via INTRAVENOUS

## 2024-03-09 MED ORDER — AMLODIPINE BESYLATE 2.5 MG PO TABS
2.5000 mg | ORAL_TABLET | Freq: Every day | ORAL | Status: DC
  Administered 2024-03-09 – 2024-03-10 (×2): 2.5 mg via ORAL
  Filled 2024-03-09 (×3): qty 1

## 2024-03-09 MED ORDER — OXYCODONE HCL 5 MG/5ML PO SOLN: 5.0000 mg | Freq: Once | ORAL | Status: DC | PRN

## 2024-03-09 MED ORDER — DIPHENHYDRAMINE HCL 50 MG/ML IJ SOLN
INTRAMUSCULAR | Status: DC | PRN
  Administered 2024-03-09: 12.5 mg via INTRAVENOUS

## 2024-03-09 MED ORDER — PANTOPRAZOLE SODIUM 40 MG PO TBEC
40.0000 mg | DELAYED_RELEASE_TABLET | Freq: Every day | ORAL | Status: DC
  Administered 2024-03-09 – 2024-03-10 (×2): 40 mg via ORAL
  Filled 2024-03-09 (×2): qty 1

## 2024-03-09 MED ORDER — CHLORHEXIDINE GLUCONATE 0.12 % MT SOLN
15.0000 mL | Freq: Once | OROMUCOSAL | Status: AC
  Administered 2024-03-09: 15 mL via OROMUCOSAL
  Filled 2024-03-09: qty 15

## 2024-03-09 MED ORDER — DIPHENHYDRAMINE HCL 50 MG/ML IJ SOLN
INTRAMUSCULAR | Status: AC
  Filled 2024-03-09: qty 1

## 2024-03-09 MED ORDER — DULOXETINE HCL 60 MG PO CPEP
60.0000 mg | ORAL_CAPSULE | Freq: Every day | ORAL | Status: DC
  Administered 2024-03-09 – 2024-03-10 (×2): 60 mg via ORAL
  Filled 2024-03-09 (×2): qty 1

## 2024-03-09 MED ORDER — FENTANYL CITRATE (PF) 100 MCG/2ML IJ SOLN
INTRAMUSCULAR | Status: AC
Start: 2024-03-09 — End: 2024-03-09
  Filled 2024-03-09: qty 2

## 2024-03-09 MED ORDER — ADULT MULTIVITAMIN W/MINERALS CH: 1.0000 | ORAL_TABLET | Freq: Every day | ORAL | Status: DC

## 2024-03-09 MED ORDER — CYCLOBENZAPRINE HCL 5 MG PO TABS
5.0000 mg | ORAL_TABLET | Freq: Four times a day (QID) | ORAL | Status: DC | PRN
  Administered 2024-03-09 – 2024-03-11 (×4): 5 mg via ORAL
  Filled 2024-03-09 (×4): qty 1

## 2024-03-09 MED ORDER — DIPHENHYDRAMINE HCL 50 MG/ML IJ SOLN
12.5000 mg | Freq: Once | INTRAMUSCULAR | Status: AC
  Administered 2024-03-09: 12.5 mg via INTRAVENOUS

## 2024-03-09 MED ORDER — THROMBIN 20000 UNITS EX SOLR
CUTANEOUS | Status: AC
Start: 2024-03-09 — End: 2024-03-09
  Filled 2024-03-09: qty 20000

## 2024-03-09 MED ORDER — ROSUVASTATIN CALCIUM 5 MG PO TABS
5.0000 mg | ORAL_TABLET | Freq: Every day | ORAL | Status: DC
  Administered 2024-03-09 – 2024-03-10 (×2): 5 mg via ORAL
  Filled 2024-03-09 (×2): qty 1

## 2024-03-09 MED ORDER — HYDROXYZINE HCL 50 MG/ML IM SOLN
50.0000 mg | Freq: Four times a day (QID) | INTRAMUSCULAR | Status: DC | PRN
  Administered 2024-03-09 – 2024-03-10 (×3): 50 mg via INTRAMUSCULAR
  Filled 2024-03-09 (×3): qty 1

## 2024-03-09 MED ORDER — CYCLOBENZAPRINE HCL 10 MG PO TABS: 10.0000 mg | ORAL_TABLET | Freq: Three times a day (TID) | ORAL | Status: DC | PRN

## 2024-03-09 MED ORDER — ACETAMINOPHEN 650 MG RE SUPP: 650.0000 mg | RECTAL | Status: DC | PRN

## 2024-03-09 MED ORDER — THROMBIN 20000 UNITS EX SOLR
CUTANEOUS | Status: DC | PRN
  Administered 2024-03-09: 20 mL via TOPICAL

## 2024-03-09 MED ORDER — ONDANSETRON HCL 4 MG/2ML IJ SOLN
INTRAMUSCULAR | Status: DC | PRN
  Administered 2024-03-09: 4 mg via INTRAVENOUS

## 2024-03-09 MED ORDER — THROMBIN 5000 UNITS EX KIT
PACK | CUTANEOUS | Status: AC
  Filled 2024-03-09: qty 1

## 2024-03-09 MED ORDER — BENAZEPRIL HCL 5 MG PO TABS
10.0000 mg | ORAL_TABLET | Freq: Every day | ORAL | Status: DC
  Administered 2024-03-09 – 2024-03-10 (×2): 10 mg via ORAL
  Filled 2024-03-09 (×3): qty 2

## 2024-03-09 MED ORDER — OXYCODONE HCL 5 MG PO TABS
5.0000 mg | ORAL_TABLET | ORAL | Status: DC | PRN
  Administered 2024-03-09 – 2024-03-11 (×6): 5 mg via ORAL
  Filled 2024-03-09 (×6): qty 1

## 2024-03-09 MED ORDER — SCOPOLAMINE 1 MG/3DAYS TD PT72
MEDICATED_PATCH | TRANSDERMAL | Status: AC
  Filled 2024-03-09: qty 1

## 2024-03-09 MED ORDER — ALUM & MAG HYDROXIDE-SIMETH 200-200-20 MG/5ML PO SUSP
30.0000 mL | Freq: Four times a day (QID) | ORAL | Status: DC | PRN
  Administered 2024-03-09 – 2024-03-11 (×2): 30 mL via ORAL
  Filled 2024-03-09 (×2): qty 30

## 2024-03-09 MED ORDER — ALBUMIN HUMAN 5 % IV SOLN: INTRAVENOUS | Status: DC | PRN

## 2024-03-09 MED ORDER — AMISULPRIDE (ANTIEMETIC) 5 MG/2ML IV SOLN
10.0000 mg | Freq: Once | INTRAVENOUS | Status: AC | PRN
  Administered 2024-03-09: 10 mg via INTRAVENOUS

## 2024-03-09 MED ORDER — SODIUM CHLORIDE 0.9% FLUSH
3.0000 mL | Freq: Two times a day (BID) | INTRAVENOUS | Status: DC
  Administered 2024-03-09 – 2024-03-10 (×3): 3 mL via INTRAVENOUS

## 2024-03-09 MED ORDER — HYDROMORPHONE HCL 1 MG/ML IJ SOLN
INTRAMUSCULAR | Status: AC
Start: 2024-03-09 — End: 2024-03-09
  Filled 2024-03-09: qty 1

## 2024-03-09 MED ORDER — CEFAZOLIN SODIUM-DEXTROSE 2-4 GM/100ML-% IV SOLN
2.0000 g | INTRAVENOUS | Status: AC
  Administered 2024-03-09 (×2): 2 g via INTRAVENOUS
  Filled 2024-03-09: qty 100

## 2024-03-09 SURGICAL SUPPLY — 75 items
BAG COUNTER SPONGE SURGICOUNT (BAG) ×2 IMPLANT
BASKET BONE COLLECTION (BASKET) ×2 IMPLANT
BENZOIN TINCTURE PRP APPL 2/3 (GAUZE/BANDAGES/DRESSINGS) ×2 IMPLANT
BLADE BONE MILL MEDIUM (MISCELLANEOUS) ×2 IMPLANT
BLADE CLIPPER SURG (BLADE) IMPLANT
BLADE SURG 11 STRL SS (BLADE) ×2 IMPLANT
BONE VIVIGEN FORMABLE 5.4CC (Bone Implant) ×1 IMPLANT
BUR CUTTER 7.0 ROUND (BURR) ×2 IMPLANT
BUR MATCHSTICK NEURO 3.0 LAGG (BURR) ×2 IMPLANT
CAGE ALTERA 8X12-8 (Cage) IMPLANT
CANISTER SUCTION 3000ML PPV (SUCTIONS) ×2 IMPLANT
CAP LOCKING THREADED (Cap) IMPLANT
CNTNR URN SCR LID CUP LEK RST (MISCELLANEOUS) ×2 IMPLANT
CONNECTOR HEAD TO 5.5-6.35 12 (Connector) IMPLANT
CONNECTOR LATERAL (Connector) IMPLANT
COVER BACK TABLE 60X90IN (DRAPES) ×2 IMPLANT
DERMABOND ADVANCED .7 DNX12 (GAUZE/BANDAGES/DRESSINGS) ×2 IMPLANT
DERMABOND ADVANCED .7 DNX6 (GAUZE/BANDAGES/DRESSINGS) IMPLANT
DRAPE C-ARM 42X72 X-RAY (DRAPES) ×2 IMPLANT
DRAPE C-ARMOR (DRAPES) IMPLANT
DRAPE HALF SHEET 40X57 (DRAPES) IMPLANT
DRAPE LAPAROTOMY 100X72X124 (DRAPES) ×2 IMPLANT
DRAPE SURG 17X23 STRL (DRAPES) ×2 IMPLANT
DRSG OPSITE 4X5.5 SM (GAUZE/BANDAGES/DRESSINGS) ×2 IMPLANT
DRSG OPSITE POSTOP 4X10 (GAUZE/BANDAGES/DRESSINGS) IMPLANT
DRSG OPSITE POSTOP 4X6 (GAUZE/BANDAGES/DRESSINGS) ×2 IMPLANT
DURAPREP 26ML APPLICATOR (WOUND CARE) ×2 IMPLANT
ELECTRODE REM PT RTRN 9FT ADLT (ELECTROSURGICAL) ×2 IMPLANT
EVACUATOR 1/8 PVC DRAIN (DRAIN) ×2 IMPLANT
GAUZE 4X4 16PLY ~~LOC~~+RFID DBL (SPONGE) IMPLANT
GAUZE SPONGE 4X4 12PLY STRL (GAUZE/BANDAGES/DRESSINGS) ×2 IMPLANT
GLOVE BIO SURGEON STRL SZ7 (GLOVE) IMPLANT
GLOVE BIO SURGEON STRL SZ8 (GLOVE) ×4 IMPLANT
GLOVE BIOGEL PI IND STRL 7.0 (GLOVE) IMPLANT
GLOVE BIOGEL PI IND STRL 7.5 (GLOVE) IMPLANT
GLOVE BIOGEL PI IND STRL 8 (GLOVE) IMPLANT
GLOVE EXAM NITRILE XL STR (GLOVE) IMPLANT
GLOVE INDICATOR 8.5 STRL (GLOVE) ×4 IMPLANT
GLOVE SURG SS PI 7.5 STRL IVOR (GLOVE) IMPLANT
GOWN STRL REUS W/ TWL LRG LVL3 (GOWN DISPOSABLE) IMPLANT
GOWN STRL REUS W/ TWL XL LVL3 (GOWN DISPOSABLE) ×4 IMPLANT
GOWN STRL REUS W/TWL 2XL LVL3 (GOWN DISPOSABLE) IMPLANT
GRAFT BNE MATRIX VG FRMBL MD 5 (Bone Implant) IMPLANT
KIT BASIN OR (CUSTOM PROCEDURE TRAY) ×2 IMPLANT
KIT INFUSE X SMALL 1.4CC (Orthopedic Implant) IMPLANT
KIT TURNOVER KIT B (KITS) ×2 IMPLANT
MILL BONE PREP (MISCELLANEOUS) ×2 IMPLANT
NDL HYPO 21X1.5 SAFETY (NEEDLE) ×2 IMPLANT
NDL HYPO 25X1 1.5 SAFETY (NEEDLE) ×2 IMPLANT
NEEDLE HYPO 21X1.5 SAFETY (NEEDLE) ×1 IMPLANT
NEEDLE HYPO 25X1 1.5 SAFETY (NEEDLE) ×1 IMPLANT
NS IRRIG 1000ML POUR BTL (IV SOLUTION) ×2 IMPLANT
PACK LAMINECTOMY NEURO (CUSTOM PROCEDURE TRAY) ×2 IMPLANT
PAD ARMBOARD POSITIONER FOAM (MISCELLANEOUS) ×6 IMPLANT
PATTIES SURGICAL .5 X.5 (GAUZE/BANDAGES/DRESSINGS) ×2 IMPLANT
PATTIES SURGICAL 1X1 (DISPOSABLE) ×2 IMPLANT
ROD RELINE-O 5.5X100 LORD (Rod) IMPLANT
ROD RELINE-O LORD 5.5X110MM (Rod) IMPLANT
SCREW LOCK RELINE 5.5 TULIP (Screw) IMPLANT
SCREW RELINE-O POLY 5.0X40 (Screw) IMPLANT
SPACER SUSTAIN TI 9X22X12 8D (Spacer) IMPLANT
SPIKE FLUID TRANSFER (MISCELLANEOUS) ×2 IMPLANT
SPONGE SURGIFOAM ABS GEL 100 (HEMOSTASIS) ×2 IMPLANT
SPONGE T-LAP 4X18 ~~LOC~~+RFID (SPONGE) IMPLANT
STRIP CLOSURE SKIN 1/2X4 (GAUZE/BANDAGES/DRESSINGS) ×4 IMPLANT
SUT VIC AB 0 CT1 18XCR BRD8 (SUTURE) ×2 IMPLANT
SUT VIC AB 2-0 CT1 18 (SUTURE) ×2 IMPLANT
SUT VIC AB 4-0 PS2 27 (SUTURE) ×2 IMPLANT
SYR 20ML LL LF (SYRINGE) ×2 IMPLANT
SYR 30ML SLIP (SYRINGE) ×2 IMPLANT
TOWEL GREEN STERILE (TOWEL DISPOSABLE) ×2 IMPLANT
TOWEL GREEN STERILE FF (TOWEL DISPOSABLE) ×2 IMPLANT
TRAY FOLEY MTR SLVR 14FR STAT (SET/KITS/TRAYS/PACK) IMPLANT
TRAY FOLEY MTR SLVR 16FR STAT (SET/KITS/TRAYS/PACK) ×2 IMPLANT
WATER STERILE IRR 1000ML POUR (IV SOLUTION) ×2 IMPLANT

## 2024-03-09 NOTE — Anesthesia Procedure Notes (Signed)
 Procedure Name: Intubation Date/Time: 03/09/2024 9:47 AM  Performed by: Lockie Flesher, CRNAPre-anesthesia Checklist: Patient identified, Emergency Drugs available, Suction available and Patient being monitored Patient Re-evaluated:Patient Re-evaluated prior to induction Oxygen Delivery Method: Circle system utilized Preoxygenation: Pre-oxygenation with 100% oxygen Induction Type: IV induction Ventilation: Mask ventilation without difficulty and Oral airway inserted - appropriate to patient size Laryngoscope Size: Mac and 3 Grade View: Grade I Tube type: Oral Tube size: 7.0 mm Number of attempts: 1 Airway Equipment and Method: Stylet Placement Confirmation: ETT inserted through vocal cords under direct vision, positive ETCO2 and breath sounds checked- equal and bilateral Secured at: 23 cm Tube secured with: Tape Dental Injury: Teeth and Oropharynx as per pre-operative assessment

## 2024-03-09 NOTE — Anesthesia Preprocedure Evaluation (Signed)
 Anesthesia Evaluation  Patient identified by MRN, date of birth, ID band Patient awake    Reviewed: Allergy & Precautions, H&P , NPO status , Patient's Chart, lab work & pertinent test results  History of Anesthesia Complications (+) PONV and history of anesthetic complications  Airway Mallampati: II   Neck ROM: full    Dental   Pulmonary former smoker   breath sounds clear to auscultation       Cardiovascular hypertension,  Rhythm:regular Rate:Normal     Neuro/Psych  Headaches    GI/Hepatic PUD,,,  Endo/Other    Renal/GU      Musculoskeletal  (+) Arthritis ,    Abdominal   Peds  Hematology   Anesthesia Other Findings   Reproductive/Obstetrics                              Anesthesia Physical Anesthesia Plan  ASA: 2  Anesthesia Plan: General   Post-op Pain Management:    Induction: Intravenous  PONV Risk Score and Plan: 4 or greater and Ondansetron , Dexamethasone , Midazolam  and Treatment may vary due to age or medical condition  Airway Management Planned: Oral ETT  Additional Equipment:   Intra-op Plan:   Post-operative Plan: Extubation in OR  Informed Consent: I have reviewed the patients History and Physical, chart, labs and discussed the procedure including the risks, benefits and alternatives for the proposed anesthesia with the patient or authorized representative who has indicated his/her understanding and acceptance.     Dental advisory given  Plan Discussed with: CRNA, Anesthesiologist and Surgeon  Anesthesia Plan Comments:         Anesthesia Quick Evaluation

## 2024-03-09 NOTE — Op Note (Signed)
 Preoperative diagnosis: Lumbar spinal stenosis degenerative disc disease degenerative scoliosis L1-2 and L2-3 with neurogenic claudication and severe mechanical back pain.  Postoperative diagnosis: Same.  Procedure: #1 decompressive laminectomies L1-2 and L2-3 with complete medial facetectomies Rodick foraminotomies of the L1-L2 and redo foraminotomies of the L3 nerve roots this was in excess and requiring more work to would be needed with a standard interbody fusion..  2.  Posterior lumbar interbody fusions L2-3 utilizing globus titanium cages packed with locally harvested autograft mixed with Vivigen and BMP.  3.  Transforaminal interbody fusion L1-L2 pleasant globus Altera transforaminal interbody cage.  4.  Pedicle screw fixation L1-L3 utilizing the globus Creo amp pedicle screw set and connectors to [facility] up to previous L3-L5 fusion.  5.  Posterolateral arthrodesis L1-L2 3 utilizing locally harvested autograft mixed with Vivigen and BMP.  6.  Open reduction of spinal formerly.  Surgeon: Arley helling.  Assistant: Suzen Click.  Anesthesia: General.  EBL: 500 with 250 Cell Saver given back.  HPI: 63 year old female longstanding back and right greater than left leg pain workup revealed segmental degeneration above her previous L3-L5 fusion with significant scoliotic deformity and degenerative collapse with lumbar spinal stenosis at L1-2 and L2-3.  Due to the patient's progression of clinical syndrome imaging findings of a conservative treatment I recommended decompressive laminectomies and interbody fusions at those levels.  I extensively reviewed the risks and benefits of the operation with the patient as well as perioperative course expectations of outcome and alternatives to surgery and she understood and agreed to proceed forward.  Operative procedure: Patient was brought into the OR was induced to general esthesia positioned prone Wilson frame her back was prepped and draped in routine  sterile fashion.  Roll incision was opened and extended cephalad scar tissue was dissected free and subperiosteal dissection was carried lamina of L1-L2 and exposing the construct of previous hardware from L3-L4.  Working directly at the level of the top of the previous screws at L3 I remove the spinous process of L2 as well as L1 perform central decompression performed complete medial facetectomies and radical foraminotomies of the L1, L2, and L3 nerve roots.  Extensive scar tissue on the L3 nerve roots this required extensive freeing up of the adhesions and extending the foraminotomies aggressive under biting of both superior tickling facets gained access to the lateral margin of the space of both levels.  Then first working at L1-L2 to space was incised was noted be markedly collapsed and I was not able to distract the patient's left side the disc base open at all I was able to open up the right side of the space and so I opened up the tract and cleaned out the interspace decided to convert that from uplift to a TLIF which I performed with the Altera cage and after adequate endplate preparation and discectomy from both sides packed extensive mount of autograft material and BMP anteriorly and off to the left and inserted the cage from the right and opened up to create some lordosis.  Did this under fluoroscopy to confirm good position.  I then in a similar fashion performed a plate at O7-6 with and endplate preparation complete discectomy extensive mount of autograft mix between the 2 sustained titanium cages and this opened up the disc base and helped reduce the listhesis and and hyperlordosis.  Then placed pedicle screws under AP and lateral fluoroscopy at L1 and L2 then utilizing connectors I connected into the rod between L3 and L4 contoured  the rods and positioned the screws.  Then prior to rod placement aggressive decorticated TPs and lateral facet joints and packed an extensive mount of autograft material  posterior laterally at L1-2 and L2-3 then connect the rod compressed on the right side at L1-L2 to help reduce the scoliotic deformity anchored everything else in place final tightened all the connectors in all the new pedicle screws.  Construct appear to be solid.  Then inspected all the foramina to confirm patency and no migration of graft material laid Gelfoam atop the dura placed a medium Hemovac drain injected Exparel  in the fascia and closed the wound in layers with interrupted Vicryl and a running 4 subcuticular.  Dermabond benzoin Steri-Strips and sterile dressing was applied patient to cover him in stable condition.  At the end of Sendil count sponge counts were correct.

## 2024-03-09 NOTE — H&P (Signed)
 Shawna Dean is an 63 y.o. female.   Chief Complaint: Back and right greater than left leg pain HPI: 63 year old female with previous history of L3-L5 fusion fusion presents with progressive worsening back and bilateral hip and leg pain workup revealed degenerative disc disease degenerative scoliosis and spinal stenosis at L2-3 and L1-L2 above her previous fusion.  Due to patient's progression of clinical syndrome imaging findings of a conservative treatment I recommended decompressive laminectomies and interbody fusions of those 2 levels connecting up into her old construct.  I extensively reviewed the risks and benefits of the operation with her as well as perioperative course expectations of outcome and alternatives to surgery and she understood and agreed to proceed forward.  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   PONV (postoperative nausea and vomiting)    Ulcerative proctitis (HCC)    suppository as needed for flare ups    Past Surgical History:  Procedure Laterality Date   ANTERIOR CRUCIATE LIGAMENT REPAIR Left    BACK SURGERY  10/01/2015, 12/18   BREAST ENHANCEMENT SURGERY     replaced a second time 9/11   BREAST REDUCTION SURGERY  03/03/2018   with bilateral implant removal and replacement   BUNIONECTOMY Left    COLONOSCOPY     COLONOSCOPY W/ BIOPSIES  08/17/2009   POLYPECTOMY     Spinal Cyst Aspiration     back and right side of face    Family History  Problem Relation Age of  Onset   Lymphoma Mother 51   Parkinson's disease Father    Hypertension Father    Colon cancer Neg Hx    Colon polyps Neg Hx    Esophageal cancer Neg Hx    Rectal cancer Neg Hx    Stomach cancer Neg Hx    Social History:  reports that she has quit smoking. She has never used smokeless tobacco. She reports that she does not drink alcohol and does not use drugs.  Allergies:  Allergies  Allergen Reactions   Bee Venom Hives   Nsaids Other (See Comments)    UC flare   Wasp Venom Hives   Cephalexin     Other Reaction(s): Stomach Pain   Other Hives    YELLOW JACKETS     Medications Prior to Admission  Medication Sig Dispense Refill   amlodipine -benazepril  (LOTREL) 2.5-10 MG per capsule Take 1 capsule by mouth daily.     Calcium  Carb-Cholecalciferol  (CALCIUM +D3 PO) Take 1 tablet by mouth daily.     CONCERTA  54 MG CR tablet Take 54 mg by mouth every morning.  0   cyclobenzaprine  (FLEXERIL ) 5 MG tablet Take 5 mg by mouth 4 (four) times daily as needed (stenosis).     DULoxetine  (CYMBALTA ) 60 MG capsule Take 60 mg by mouth daily.     ELDERBERRY PO Take 1 capsule by mouth daily.     estradiol  (ESTRACE ) 0.1 MG/GM vaginal  cream Place 1 Applicatorful vaginally at bedtime. Use twice weekly, please refill (Patient taking differently: Place 1 Applicatorful vaginally 2 (two) times a week. Use twice weekly, please refill) 42.5 g 12   Multiple Vitamin (MULTIVITAMIN) tablet Take 1 tablet by mouth daily.     PERCOCET 10-325 MG tablet Take 1 tablet by mouth every 4 (four) hours as needed for pain.     rosuvastatin  (CRESTOR ) 5 MG tablet Take 5 mg by mouth daily.     traZODone  (DESYREL ) 100 MG tablet Take 25-50 mg by mouth at bedtime as needed for sleep.     mesalamine  (CANASA ) 1000 MG suppository Place 1 suppository (1,000 mg total) rectally daily as needed. For flare ups 30 suppository 3    No results found for this or any previous visit (from the past 48 hours). No results found.  Review of  Systems  Musculoskeletal:  Positive for back pain.  Neurological:  Positive for weakness and numbness.    Blood pressure 129/87, pulse 79, temperature 98.4 F (36.9 C), temperature source Oral, resp. rate 18, height 5' 1 (1.549 m), weight 61.2 kg, last menstrual period 03/02/2011, SpO2 96%. Physical Exam HENT:     Head: Normocephalic.     Right Ear: Tympanic membrane normal.     Nose: Nose normal.     Mouth/Throat:     Mouth: Mucous membranes are moist.  Cardiovascular:     Rate and Rhythm: Normal rate.     Pulses: Normal pulses.  Pulmonary:     Effort: Pulmonary effort is normal.  Musculoskeletal:        General: Normal range of motion.     Cervical back: Normal range of motion.  Neurological:     Mental Status: She is alert.     Comments: Strength is 5 out of 5 iliopsoas, quads, hamstrings, gastroc, tibialis, and EHL.      Assessment/Plan 63 year old female presents for L1-2 L2-3 extension with decompressive laminectomies and interbody fusions at both levels.  Arley SHAUNNA Helling, MD 03/09/2024, 9:25 AM

## 2024-03-09 NOTE — Transfer of Care (Signed)
 Immediate Anesthesia Transfer of Care Note  Patient: Shawna Dean  Procedure(s) Performed: POSTERIOR  LUMBAR INTERBODY  FUSION LUMBAR TWO-LUMBAR THREE; LUMBAR ONE-LUMBAR TWO EXTENSION  FUSION WITH ADDITION POSTERIOR LATERAL  INTERBODY LUMBAR FUSION (Back)  Patient Location: PACU  Anesthesia Type:General  Level of Consciousness: awake, alert , and oriented  Airway & Oxygen Therapy: Patient Spontanous Breathing and Patient connected to nasal cannula oxygen  Post-op Assessment: Report given to RN and Post -op Vital signs reviewed and stable  Post vital signs: Reviewed and stable  Last Vitals:  Vitals Value Taken Time  BP 125/89 03/09/24 14:39  Temp 36.7 C 03/09/24 14:39  Pulse 103 03/09/24 14:43  Resp 42 03/09/24 14:43  SpO2 95 % 03/09/24 14:43  Vitals shown include unfiled device data.  Last Pain:  Vitals:   03/09/24 1439  TempSrc:   PainSc: Asleep      Patients Stated Pain Goal: 2 (03/09/24 9171)  Complications: No notable events documented.

## 2024-03-09 NOTE — Plan of Care (Signed)

## 2024-03-10 NOTE — Anesthesia Postprocedure Evaluation (Signed)
 Anesthesia Post Note  Patient: Shawna Dean  Procedure(s) Performed: POSTERIOR  LUMBAR INTERBODY  FUSION LUMBAR TWO-LUMBAR THREE; LUMBAR ONE-LUMBAR TWO EXTENSION  FUSION WITH ADDITION POSTERIOR LATERAL  INTERBODY LUMBAR FUSION (Back)     Patient location during evaluation: PACU Anesthesia Type: General Level of consciousness: awake and alert Pain management: pain level controlled Vital Signs Assessment: post-procedure vital signs reviewed and stable Respiratory status: spontaneous breathing, nonlabored ventilation, respiratory function stable and patient connected to nasal cannula oxygen Cardiovascular status: blood pressure returned to baseline and stable Postop Assessment: no apparent nausea or vomiting Anesthetic complications: no   No notable events documented.  Last Vitals:  Vitals:   03/10/24 0310 03/10/24 0737  BP: (!) 142/86 (!) 142/78  Pulse: 88 98  Resp: 18 16  Temp: 37.2 C 36.9 C  SpO2: 98% 98%    Last Pain:  Vitals:   03/10/24 0737  TempSrc: Oral  PainSc:                  Aamirah Salmi S

## 2024-03-10 NOTE — Evaluation (Signed)
 Occupational Therapy Evaluation Patient Details Name: Shawna Dean MRN: [id number] DOB: July 08, 1961 Today's Date: 03/10/2024   History of Present Illness   Shawna Dean is an 63 y.o. female who is s/p L1-2 L2-3 interbody fusion extension 7/9. PMHx: arthritis, HLD, HTN, PONV     Clinical Impressions Shawna Dean was evaluated s/p the above spine surgery. She is indep and lives with family at baseline. Upon evaluation pt was limited by surgical pain, PONV, spinal precautions and decreased activity tolerance. Overall she demonstrated mod I ability to complete ADLs and mobility without DME. Provided cues and education on spinal precautions and compensatory techniques throughout, handout provided and pt demonstrated good recall during ADLs and mobility. Pt does not require further acute OT services. Recommend d/c home with support of family.       If plan is discharge home, recommend the following:   Assist for transportation;Assistance with cooking/housework     Functional Status Assessment   Patient has had a recent decline in their functional status and demonstrates the ability to make significant improvements in function in a reasonable and predictable amount of time.     Equipment Recommendations   None recommended by OT      Precautions/Restrictions   Precautions Precautions: Fall;Back Precaution Booklet Issued: Yes (comment) Recall of Precautions/Restrictions: Intact Required Braces or Orthoses: Spinal Brace Spinal Brace: Lumbar corset;Applied in sitting position Restrictions Weight Bearing Restrictions Per Provider Order: No     Mobility Bed Mobility Overal bed mobility: Needs Assistance Bed Mobility: Rolling, Supine to Sit, Sit to Sidelying Rolling: Supervision   Supine to sit: Supervision   Sit to sidelying: Supervision      Transfers Overall transfer level: Needs assistance Equipment used: None Transfers: Sit to/from Stand Sit to Stand: Modified  independent (Device/Increase time)                  Balance Overall balance assessment: Mild deficits observed, not formally tested                                         ADL either performed or assessed with clinical judgement   ADL Overall ADL's : Needs assistance/impaired                                       General ADL Comments: generalized superivsion A provided but pt depmonstrated mod I ability, no physical assist required. No DME. Brief review of compensatory techniques. Good carryover.     Vision Baseline Vision/History: 1 Wears glasses Vision Assessment?: No apparent visual deficits     Perception Perception: Impaired       Praxis Praxis: WFL       Pertinent Vitals/Pain Pain Assessment Pain Assessment: Faces Faces Pain Scale: Hurts little more Pain Location: surgical site Pain Descriptors / Indicators: Discomfort, Grimacing, Guarding Pain Intervention(s): Limited activity within patient's tolerance, Monitored during session     Extremity/Trunk Assessment Upper Extremity Assessment Upper Extremity Assessment: Overall WFL for tasks assessed   Lower Extremity Assessment Lower Extremity Assessment: Overall WFL for tasks assessed   Cervical / Trunk Assessment Cervical / Trunk Assessment: Back Surgery   Communication Communication Communication: No apparent difficulties   Cognition Arousal: Alert Behavior During Therapy: WFL for tasks assessed/performed Cognition: No apparent impairments  Following commands: Intact       Cueing  General Comments      VSS    Home Living Family/patient expects to be discharged to:: Private residence Living Arrangements: Spouse/significant other Available Help at Discharge: Family;Available 24 hours/day Type of Home: House       Home Layout: One level     Bathroom Shower/Tub: Chief Strategy Officer: Standard      Home Equipment: Pharmacist, hospital (2 wheels)          Prior Functioning/Environment Prior Level of Function : Independent/Modified Independent;Driving                    OT Problem List: Decreased activity tolerance;Decreased safety awareness;Decreased knowledge of use of DME or AE;Decreased knowledge of precautions   OT Treatment/Interventions:        OT Goals(Current goals can be found in the care plan section)   Acute Rehab OT Goals Patient Stated Goal: to feel better OT Goal Formulation: With patient Time For Goal Achievement: 03/10/24 Potential to Achieve Goals: Good   AM-PAC OT 6 Clicks Daily Activity     Outcome Measure Help from another person eating meals?: None Help from another person taking care of personal grooming?: None Help from another person toileting, which includes using toliet, bedpan, or urinal?: None Help from another person bathing (including washing, rinsing, drying)?: None Help from another person to put on and taking off regular upper body clothing?: None Help from another person to put on and taking off regular lower body clothing?: None 6 Click Score: 24   End of Session Equipment Utilized During Treatment: Back brace Nurse Communication: Mobility status  Activity Tolerance: Patient tolerated treatment well Patient left: in bed;with call bell/phone within reach;with chair alarm set  OT Visit Diagnosis: Muscle weakness (generalized) (M62.81)                Time: 9159-9141 OT Time Calculation (min): 18 min Charges:  OT General Charges $OT Visit: 1 Visit OT Evaluation $OT Eval Low Complexity: 1 Low  Lucie Kendall, OTR/L Acute Rehabilitation Services Office [phone] Secure Chat Communication Preferred   Lucie JONETTA Kendall 03/10/2024, 11:03 AM

## 2024-03-10 NOTE — Progress Notes (Signed)
 Subjective: Patient reports doing well condition of back pain improved leg pain also still little bit of nausea and stomachache  Objective: Vital signs in last 24 hours: Temp:  [97.7 F (36.5 C)-99.7 F (37.6 C)] 98.4 F (36.9 C) (07/10 0737) Pulse Rate:  [75-113] 98 (07/10 0737) Resp:  [12-22] 16 (07/10 0737) BP: (103-159)/(71-100) 142/78 (07/10 0737) SpO2:  [94 %-100 %] 98 % (07/10 0737) Weight:  [61.2 kg] 61.2 kg (07/09 0808)  Intake/Output from previous day: 07/09 0701 - 07/10 0700 In: 1800 [I.V.:1300; Blood:250; IV Piggyback:250] Out: 1535 [Urine:425; Drains:460; Blood:650] Intake/Output this shift: No intake/output data recorded.  Awake alert strength is 5 out of 5 incision clean dry and intact  Lab Results: No results for input(s): WBC, HGB, HCT, PLT in the last 72 hours. BMET No results for input(s): NA, K, CL, CO2, GLUCOSE, BUN, CREATININE, CALCIUM  in the last 72 hours.  Studies/Results: DG Lumbar Spine 1 View Result Date: 03/09/2024 CLINICAL DATA:  Elective surgery. EXAM: LUMBAR SPINE - 1 VIEW COMPARISON:  Preoperative imaging FINDINGS: Single fluoroscopic spot view of the lumbar spine submitted from the operating room. There are pedicle screws at 3 contiguous levels with interbody spacers, cannot determine levels due to coned view. Fluoroscopy time 75 seconds. Dose 31.84 mGy. IMPRESSION: Intraoperative fluoroscopy during lumbar surgery. Electronically Signed   By: Andrea Gasman M.D.   On: 03/09/2024 16:27   DG C-Arm 1-60 Min-No Report Result Date: 03/09/2024 Fluoroscopy was utilized by the requesting physician.  No radiographic interpretation.   DG C-Arm 1-60 Min-No Report Result Date: 03/09/2024 Fluoroscopy was utilized by the requesting physician.  No radiographic interpretation.    Assessment/Plan: Postop day 1 L1-2 L2-3 interbody fusion extension mobilizing today with physical therapy drain output still low but hide to discontinue.  Also  the stomach is still settling down from anesthesia and pain medication so probably patient will need another day but continue to mobilize with therapy and we will see how this goes.  LOS: 1 day     Arley SHAUNNA Helling 03/10/2024, 7:46 AM

## 2024-03-11 NOTE — Discharge Instructions (Signed)
 Wound Care Keep incision covered and dry until post op day 3. You may remove the Honeycomb dressing on post op day 3. Leave steri-strips on back.  They will fall off by themselves. Do not put any creams, lotions, or ointments on incision. You are fine to shower. Let water run over incision and pat dry.   Activity Walk each and every day, increasing distance each day. No lifting greater than 8 lbs.  No lifting no bending no twisting no driving or riding a car unless coming back and forth to see the doctor. If provided with back brace, wear when out of bed.  It is not necessary to wear brace in bed.  Diet Resume your normal diet.   Return to Work Will be discussed at your follow up appointment.  Call Your Doctor If Any of These Occur Redness, drainage, or swelling at the wound.  Temperature greater than 101 degrees. Severe pain not relieved by pain medication. Incision starts to come apart.  Follow Up Appt Call [phone] if you have one or any problem.

## 2024-03-11 NOTE — Progress Notes (Signed)
 Patient alert and oriented, void, ambulate. Surgical site clean and dry no sign of infection. D/c instructions explain and given. Hemovac removed.

## 2024-03-11 NOTE — Discharge Summary (Signed)
  Physician Discharge Summary  Patient ID: Shawna Dean MRN: [id number] DOB/AGE: 65-Jul-1962 65 y.o. Estimated body mass index is 25.51 kg/m as calculated from the following:   Height as of this encounter: 5' 1 (1.549 m).   Weight as of this encounter: 61.2 kg.   Admit date: 03/09/2024 Discharge date: 03/11/2024  Admission Diagnoses: Lumbar spinal stenosis L1-L2 3 degenerative scoliosis  Discharge Diagnoses: Same Principal Problem:   Spinal stenosis of lumbar region   Discharged Condition: good  Hospital Course: Patient was admitted to hospital underwent decompressive laminectomy L1-L2 3 with interbody fusions.  Postoperatively patient did very well recovered in the floor on the floor was ambulating and voiding spontaneously tolerating regular diet stable for discharge home.  Patient will be discharged scheduled follow-up in 1 to 2 weeks.  Consults: Significant Diagnostic Studies: Treatments: Decompressive laminectomies interbody fusions L1-L2 3 Discharge Exam: Blood pressure 138/76, pulse 81, temperature 98.4 F (36.9 C), temperature source Oral, resp. rate 20, height 5' 1 (1.549 m), weight 61.2 kg, last menstrual period 03/02/2011, SpO2 100%. Strength 5 out of 5 and clean dry and intact  Disposition: Home   Allergies as of 03/11/2024       Reactions   Bee Venom Hives   Nsaids Other (See Comments)   UC flare   Wasp Venom Hives   Cephalexin    Other Reaction(s): Stomach Pain   Other Hives   YELLOW JACKETS         Medication List     TAKE these medications    amlodipine -benazepril  2.5-10 MG capsule Commonly known as: LOTREL Take 1 capsule by mouth daily.   CALCIUM +D3 PO Take 1 tablet by mouth daily.   Concerta  54 MG CR tablet Generic drug: methylphenidate  Take 54 mg by mouth every morning.   cyclobenzaprine  5 MG tablet Commonly known as: FLEXERIL  Take 5 mg by mouth 4 (four) times daily as needed (stenosis).   DULoxetine  60 MG capsule Commonly known  as: CYMBALTA  Take 60 mg by mouth daily.   ELDERBERRY PO Take 1 capsule by mouth daily.   estradiol  0.1 MG/GM vaginal cream Commonly known as: ESTRACE  Place 1 Applicatorful vaginally at bedtime. Use twice weekly, please refill What changed: when to take this   mesalamine  1000 MG suppository Commonly known as: CANASA  Place 1 suppository (1,000 mg total) rectally daily as needed. For flare ups   multivitamin tablet Take 1 tablet by mouth daily.   Percocet 10-325 MG tablet Generic drug: oxyCODONE -acetaminophen  Take 1 tablet by mouth every 4 (four) hours as needed for pain.   rosuvastatin  5 MG tablet Commonly known as: CRESTOR  Take 5 mg by mouth daily.   traZODone  100 MG tablet Commonly known as: DESYREL  Take 25-50 mg by mouth at bedtime as needed for sleep.         Signed: Arley SHAUNNA Helling 03/11/2024, 7:38 AM

## 2024-03-17 ENCOUNTER — Telehealth: Payer: Self-pay | Admitting: Internal Medicine

## 2024-03-17 MED FILL — Sodium Chloride IV Soln 0.9%: INTRAVENOUS | Qty: 2000 | Status: AC

## 2024-03-17 MED FILL — Heparin Sodium (Porcine) Inj 1000 Unit/ML: INTRAMUSCULAR | Qty: 30 | Status: AC

## 2024-03-17 NOTE — Telephone Encounter (Signed)
 Spoke with the patient. S/P spinal surgery for fusion and re-fusion per patient. Experiencing constipation x 1 week and now nausea. Difficult to bear down for bowel movements due to the pain it creates, but I can do it.  Not on stool softeners.   Asks what to do for her constipation and if she can have medication for nausea.

## 2024-03-17 NOTE — Telephone Encounter (Signed)
 Patient advised. She will contact Dr Onetha.

## 2024-03-17 NOTE — Telephone Encounter (Signed)
 Seen in 2022 for screening colonoscopy She needs to discuss with/see surgeon and/or PCP before she gets to GI   No phone treatment from us 

## 2024-03-17 NOTE — Telephone Encounter (Signed)
 Inbound call from patient states she is experiencing nausea and abdominal pain due to her just having surgery.   Patient requesting to have a medication sent into the pharmacy.   Offered next available appointment but patient stated there was no way she could come into the office.   Last seen in 2022  Please advise. Thank you

## 2024-03-24 DIAGNOSIS — M5136 Other intervertebral disc degeneration, lumbar region with discogenic back pain only: Secondary | ICD-10-CM | POA: Diagnosis not present

## 2024-03-24 DIAGNOSIS — G894 Chronic pain syndrome: Secondary | ICD-10-CM | POA: Diagnosis not present

## 2024-03-24 DIAGNOSIS — M62830 Muscle spasm of back: Secondary | ICD-10-CM | POA: Diagnosis not present

## 2024-03-24 DIAGNOSIS — M5416 Radiculopathy, lumbar region: Secondary | ICD-10-CM | POA: Diagnosis not present

## 2024-04-21 DIAGNOSIS — G894 Chronic pain syndrome: Secondary | ICD-10-CM | POA: Diagnosis not present

## 2024-04-21 DIAGNOSIS — M5416 Radiculopathy, lumbar region: Secondary | ICD-10-CM | POA: Diagnosis not present

## 2024-04-21 DIAGNOSIS — M51369 Other intervertebral disc degeneration, lumbar region without mention of lumbar back pain or lower extremity pain: Secondary | ICD-10-CM | POA: Diagnosis not present

## 2024-04-21 DIAGNOSIS — M62830 Muscle spasm of back: Secondary | ICD-10-CM | POA: Diagnosis not present

## 2024-04-25 ENCOUNTER — Ambulatory Visit (HOSPITAL_BASED_OUTPATIENT_CLINIC_OR_DEPARTMENT_OTHER): Payer: BC Managed Care – PPO | Admitting: Obstetrics & Gynecology

## 2024-04-26 DIAGNOSIS — M4316 Spondylolisthesis, lumbar region: Secondary | ICD-10-CM | POA: Diagnosis not present

## 2024-05-18 DIAGNOSIS — E785 Hyperlipidemia, unspecified: Secondary | ICD-10-CM | POA: Diagnosis not present

## 2024-05-18 DIAGNOSIS — Z91030 Bee allergy status: Secondary | ICD-10-CM | POA: Diagnosis not present

## 2024-05-18 DIAGNOSIS — M8588 Other specified disorders of bone density and structure, other site: Secondary | ICD-10-CM | POA: Diagnosis not present

## 2024-05-18 DIAGNOSIS — R7303 Prediabetes: Secondary | ICD-10-CM | POA: Diagnosis not present

## 2024-05-18 DIAGNOSIS — Z0000 Encounter for general adult medical examination without abnormal findings: Secondary | ICD-10-CM | POA: Diagnosis not present

## 2024-05-18 DIAGNOSIS — I10 Essential (primary) hypertension: Secondary | ICD-10-CM | POA: Diagnosis not present

## 2024-05-18 DIAGNOSIS — F1190 Opioid use, unspecified, uncomplicated: Secondary | ICD-10-CM | POA: Diagnosis not present

## 2024-05-18 DIAGNOSIS — M549 Dorsalgia, unspecified: Secondary | ICD-10-CM | POA: Diagnosis not present

## 2024-05-18 DIAGNOSIS — E559 Vitamin D deficiency, unspecified: Secondary | ICD-10-CM | POA: Diagnosis not present

## 2024-05-18 DIAGNOSIS — K5120 Ulcerative (chronic) proctitis without complications: Secondary | ICD-10-CM | POA: Diagnosis not present

## 2024-05-18 DIAGNOSIS — R413 Other amnesia: Secondary | ICD-10-CM | POA: Diagnosis not present

## 2024-05-18 DIAGNOSIS — F909 Attention-deficit hyperactivity disorder, unspecified type: Secondary | ICD-10-CM | POA: Diagnosis not present

## 2024-05-19 DIAGNOSIS — M5416 Radiculopathy, lumbar region: Secondary | ICD-10-CM | POA: Diagnosis not present

## 2024-05-19 DIAGNOSIS — M62830 Muscle spasm of back: Secondary | ICD-10-CM | POA: Diagnosis not present

## 2024-05-19 DIAGNOSIS — M51369 Other intervertebral disc degeneration, lumbar region without mention of lumbar back pain or lower extremity pain: Secondary | ICD-10-CM | POA: Diagnosis not present

## 2024-05-19 DIAGNOSIS — G894 Chronic pain syndrome: Secondary | ICD-10-CM | POA: Diagnosis not present

## 2024-06-09 DIAGNOSIS — M4316 Spondylolisthesis, lumbar region: Secondary | ICD-10-CM | POA: Diagnosis not present

## 2024-06-09 DIAGNOSIS — Z6826 Body mass index (BMI) 26.0-26.9, adult: Secondary | ICD-10-CM | POA: Diagnosis not present

## 2024-06-23 DIAGNOSIS — M5416 Radiculopathy, lumbar region: Secondary | ICD-10-CM | POA: Diagnosis not present

## 2024-06-23 DIAGNOSIS — M4316 Spondylolisthesis, lumbar region: Secondary | ICD-10-CM | POA: Diagnosis not present

## 2024-06-23 DIAGNOSIS — M51369 Other intervertebral disc degeneration, lumbar region without mention of lumbar back pain or lower extremity pain: Secondary | ICD-10-CM | POA: Diagnosis not present

## 2024-06-23 DIAGNOSIS — Z79891 Long term (current) use of opiate analgesic: Secondary | ICD-10-CM | POA: Diagnosis not present

## 2024-06-23 DIAGNOSIS — M62830 Muscle spasm of back: Secondary | ICD-10-CM | POA: Diagnosis not present

## 2024-06-23 DIAGNOSIS — G894 Chronic pain syndrome: Secondary | ICD-10-CM | POA: Diagnosis not present

## 2024-07-12 DIAGNOSIS — M25512 Pain in left shoulder: Secondary | ICD-10-CM | POA: Diagnosis not present

## 2024-07-14 DIAGNOSIS — M62830 Muscle spasm of back: Secondary | ICD-10-CM | POA: Diagnosis not present

## 2024-07-14 DIAGNOSIS — M5416 Radiculopathy, lumbar region: Secondary | ICD-10-CM | POA: Diagnosis not present

## 2024-07-14 DIAGNOSIS — M51369 Other intervertebral disc degeneration, lumbar region without mention of lumbar back pain or lower extremity pain: Secondary | ICD-10-CM | POA: Diagnosis not present

## 2024-07-14 DIAGNOSIS — G894 Chronic pain syndrome: Secondary | ICD-10-CM | POA: Diagnosis not present

## 2024-07-19 ENCOUNTER — Ambulatory Visit (HOSPITAL_BASED_OUTPATIENT_CLINIC_OR_DEPARTMENT_OTHER): Admitting: Obstetrics & Gynecology

## 2024-08-10 ENCOUNTER — Encounter (HOSPITAL_BASED_OUTPATIENT_CLINIC_OR_DEPARTMENT_OTHER): Payer: Self-pay | Admitting: Obstetrics & Gynecology

## 2024-08-10 ENCOUNTER — Ambulatory Visit (HOSPITAL_BASED_OUTPATIENT_CLINIC_OR_DEPARTMENT_OTHER): Admitting: Obstetrics & Gynecology

## 2024-08-10 VITALS — BP 125/81 | HR 81 | Ht 61.0 in | Wt 139.0 lb

## 2024-08-10 DIAGNOSIS — N952 Postmenopausal atrophic vaginitis: Secondary | ICD-10-CM

## 2024-08-10 DIAGNOSIS — Z1331 Encounter for screening for depression: Secondary | ICD-10-CM

## 2024-08-10 DIAGNOSIS — Z1231 Encounter for screening mammogram for malignant neoplasm of breast: Secondary | ICD-10-CM | POA: Diagnosis not present

## 2024-08-10 DIAGNOSIS — Z9882 Breast implant status: Secondary | ICD-10-CM

## 2024-08-10 DIAGNOSIS — Z01419 Encounter for gynecological examination (general) (routine) without abnormal findings: Secondary | ICD-10-CM

## 2024-08-10 DIAGNOSIS — M85851 Other specified disorders of bone density and structure, right thigh: Secondary | ICD-10-CM

## 2024-08-10 MED ORDER — ESTRADIOL 0.01 % VA CREA: TOPICAL_CREAM | VAGINAL | 6 refills | Status: AC

## 2024-08-10 NOTE — Progress Notes (Signed)
 Breast and Pelvic Exam Patient name: Shawna Dean MRN [id number]  Date of birth: 1960/11/20 Chief Complaint:   Gynecologic Exam  History of Present Illness:   Shawna Dean is a 63 y.o. G0P0 Caucasian female being seen today for a breast and pelvic exam.  She is feeling emotional today.  She is weaning off her Cymbalta  which she was put on for nerve pain.  Had another back surgery this year.  Had gotten to place where she couldn't feel her legs.  She stopped the 20mg  cymbalta  about a week ago.  Still having some withdrawal symptoms.  Has some questions about this.  Discussed reaching out to her pain management provider if this continues through the weekend.    Denies vaginal bleeding.    Patient's last menstrual period was 03/02/2011.   Last pap: 04/23/2023. Results were: NILM w/ HRHPV negative. H/O abnormal pap: no Last mammogram: 04/14/2022. Results were: normal. Family h/o breast cancer: no Last colonoscopy: 10/24/2020. Results were: abnormal two polyps. Family h/o colorectal cancer: no DEXA:  2018     08/10/2024    9:56 AM 08/07/2023    9:06 AM 07/08/2023    8:53 AM 04/23/2023   10:51 AM  Depression screen PHQ 2/9  Decreased Interest 0 0 0 0  Down, Depressed, Hopeless 0 0 0 0  PHQ - 2 Score 0 0 0 0    Review of Systems:   Pertinent items are noted in HPI Denies any urinary or bowel changes.  Denies pelvic pain.   Pertinent History Reviewed:  Reviewed past medical,surgical, social and family history.  Reviewed problem list, medications and allergies. Physical Assessment:   Vitals:   08/10/24 0955  BP: 125/81  Pulse: 81  SpO2: 100%  Weight: 139 lb (63 kg)  Height: 5' 1 (1.549 m)  Body mass index is 26.26 kg/m.        Physical Examination:   General appearance - well appearing, and in no distress  Mental status - alert, oriented to person, place, and time  Psych:  She has a normal mood and affect  Skin - warm and dry, normal color, no suspicious lesions  noted  Chest - effort normal, all lung fields clear to auscultation bilaterally  Heart - normal rate and regular rhythm  Neck:  midline trachea, no thyromegaly or nodules  Breasts - breasts appear normal, no suspicious masses, no skin or nipple changes or  axillary nodes, bilateral breast implants noted  Abdomen - soft, nontender, nondistended, no masses or organomegaly  Pelvic - VULVA: normal appearing vulva with no masses, tenderness or lesions   VAGINA: normal appearing vagina with normal color and discharge, no lesions   CERVIX: normal appearing cervix without discharge or lesions, no CMT  Thin prep pap is not done, not indicated   UTERUS: uterus is felt to be normal size, shape, consistency and nontender   ADNEXA: No adnexal masses or tenderness noted.  Rectal - normal rectal, good sphincter tone, no masses felt  Extremities:  No swelling or varicosities noted  Chaperone present for exam  No results found for this or any previous visit (from the past 24 hours).  Assessment & Plan:  1. Encntr for gyn exam (general) (routine) w/o abn findings (Primary) - Pap smear 2024 - Mammogram 2023.  Order placed. - Colonoscopy 2022. - Bone mineral density ordered - lab work done with PCP, Dr. Hugh - vaccines reviewed/updated  2. Osteopenia of right femoral neck - DG Bone Density; Future  3. Encounter for screening mammogram for malignant neoplasm of breast - MM 3D SCREENING MAMMOGRAM BILATERAL BREAST; Future  4. Vaginal atrophy - estradiol  (ESTRACE ) 0.01 % CREA vaginal cream; 1 gram vaginally twice weekly  Dispense: 42.5 g; Refill: 6  5. History of breast implant   Orders Placed This Encounter  Procedures   MM 3D SCREENING MAMMOGRAM BILATERAL BREAST   DG Bone Density    Meds:  Meds ordered this encounter  Medications   estradiol  (ESTRACE ) 0.01 % CREA vaginal cream    Sig: 1 gram vaginally twice weekly    Dispense:  42.5 g    Refill:  6    Please file rx for pt and she  will call when needs RF.  Thank you.    Follow-up: No follow-ups on file.  Ronal GORMAN Pinal, MD 08/10/2024 10:29 AM

## 2024-10-07 ENCOUNTER — Other Ambulatory Visit: Payer: Self-pay | Admitting: Neurosurgery

## 2024-10-07 DIAGNOSIS — M4316 Spondylolisthesis, lumbar region: Secondary | ICD-10-CM

## 2024-10-18 ENCOUNTER — Other Ambulatory Visit
